# Patient Record
Sex: Female | Born: 1980 | Race: Black or African American | Hispanic: No | Marital: Married | Smoking: Never smoker
Health system: Southern US, Community
[De-identification: ages and names within clinical notes are randomized; demographics above are authoritative.]

---

## 2002-03-30 ENCOUNTER — Encounter: Payer: Self-pay | Admitting: *Deleted

## 2002-03-30 ENCOUNTER — Emergency Department (HOSPITAL_COMMUNITY): Admission: EM | Admit: 2002-03-30 | Discharge: 2002-03-30 | Payer: Self-pay | Admitting: Podiatry

## 2003-06-10 ENCOUNTER — Other Ambulatory Visit: Admission: RE | Admit: 2003-06-10 | Discharge: 2003-06-10 | Payer: Self-pay | Admitting: Obstetrics and Gynecology

## 2003-06-10 ENCOUNTER — Other Ambulatory Visit: Admission: RE | Admit: 2003-06-10 | Discharge: 2003-06-10 | Payer: Self-pay | Admitting: *Deleted

## 2003-11-27 ENCOUNTER — Ambulatory Visit (HOSPITAL_COMMUNITY): Admission: RE | Admit: 2003-11-27 | Discharge: 2003-11-27 | Payer: Self-pay | Admitting: Obstetrics and Gynecology

## 2003-12-05 ENCOUNTER — Inpatient Hospital Stay (HOSPITAL_COMMUNITY): Admission: AD | Admit: 2003-12-05 | Discharge: 2003-12-05 | Payer: Self-pay | Admitting: *Deleted

## 2003-12-07 ENCOUNTER — Observation Stay (HOSPITAL_COMMUNITY): Admission: AD | Admit: 2003-12-07 | Discharge: 2003-12-08 | Payer: Self-pay | Admitting: Obstetrics and Gynecology

## 2003-12-17 ENCOUNTER — Inpatient Hospital Stay (HOSPITAL_COMMUNITY): Admission: AD | Admit: 2003-12-17 | Discharge: 2003-12-21 | Payer: Self-pay | Admitting: Obstetrics and Gynecology

## 2004-03-31 ENCOUNTER — Emergency Department (HOSPITAL_COMMUNITY): Admission: EM | Admit: 2004-03-31 | Discharge: 2004-03-31 | Payer: Self-pay | Admitting: *Deleted

## 2004-05-05 ENCOUNTER — Other Ambulatory Visit: Admission: RE | Admit: 2004-05-05 | Discharge: 2004-05-05 | Payer: Self-pay | Admitting: Obstetrics and Gynecology

## 2005-02-08 ENCOUNTER — Inpatient Hospital Stay (HOSPITAL_COMMUNITY): Admission: AD | Admit: 2005-02-08 | Discharge: 2005-02-11 | Payer: Self-pay | Admitting: Obstetrics and Gynecology
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
## 2005-12-06 ENCOUNTER — Other Ambulatory Visit: Admission: RE | Admit: 2005-12-06 | Discharge: 2005-12-06 | Payer: Self-pay | Admitting: Family Medicine

## 2007-06-12 ENCOUNTER — Other Ambulatory Visit: Admission: RE | Admit: 2007-06-12 | Discharge: 2007-06-12 | Payer: Self-pay | Admitting: Family Medicine

## 2008-07-12 ENCOUNTER — Other Ambulatory Visit: Admission: RE | Admit: 2008-07-12 | Discharge: 2008-07-12 | Payer: Self-pay | Admitting: Family Medicine

## 2008-10-31 IMAGING — CR DG CHEST 1V
1 series · 1 of 1 positions shown · non-contrast
Comparison: NONE

CLINICAL DATA: (+) PPD. 

CHEST - SINGLE VIEW (AP)

[view not recorded]
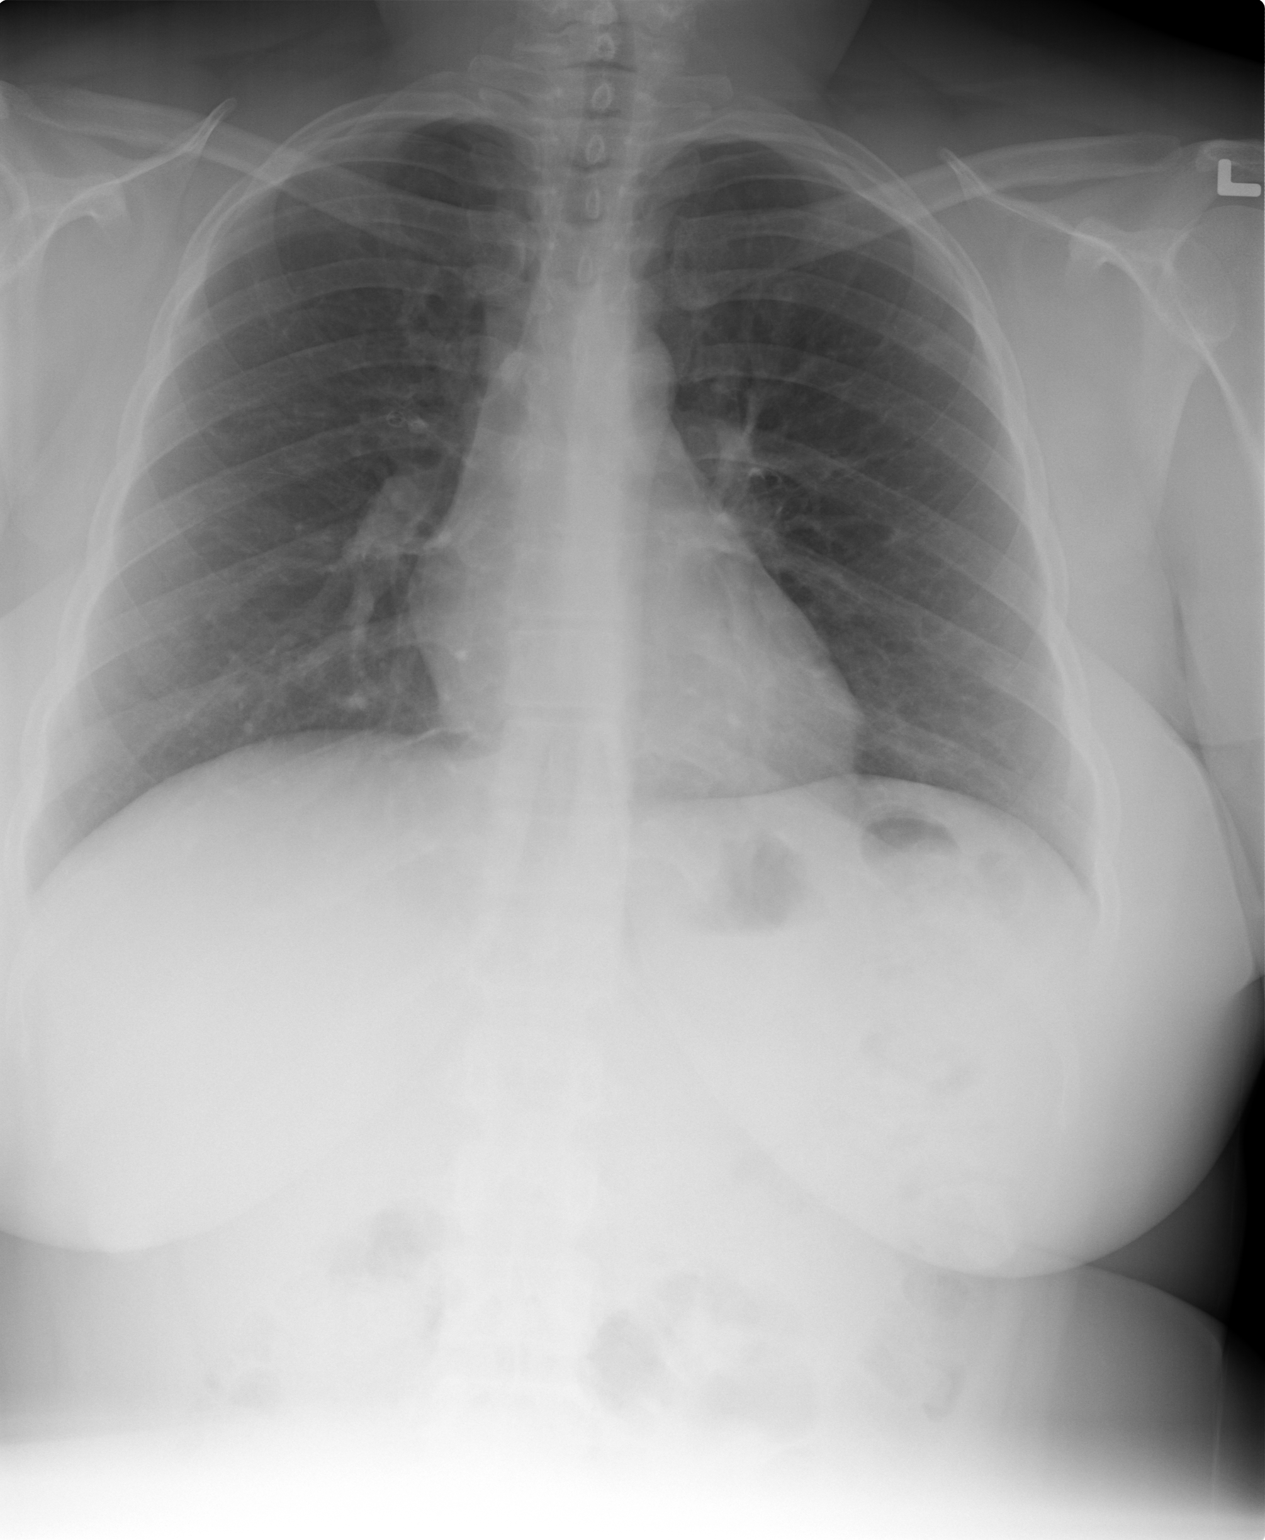

[1 of 1 positions shown; findings below may reference images not displayed]

FINDINGS: The lungs are clear and well expanded.   Heart and 
pulmonary vessels are normal. No significant abnormalities are 
noted in the regional skeleton.
IMPRESSION: No active disease.  No evidence for tuberculosis. 
Dict Date: 05/16/2006  Tran Date: 05/17/2006 NBC  JLM

## 2009-01-01 ENCOUNTER — Encounter: Admission: RE | Admit: 2009-01-01 | Discharge: 2009-01-01 | Payer: Self-pay | Admitting: Family Medicine

## 2010-01-03 ENCOUNTER — Inpatient Hospital Stay (HOSPITAL_COMMUNITY): Admission: AD | Admit: 2010-01-03 | Payer: Self-pay | Admitting: Obstetrics and Gynecology

## 2010-02-24 ENCOUNTER — Encounter: Payer: 59 | Attending: Obstetrics and Gynecology | Admitting: Dietician

## 2010-02-24 DIAGNOSIS — Z713 Dietary counseling and surveillance: Secondary | ICD-10-CM | POA: Insufficient documentation

## 2010-02-24 DIAGNOSIS — O99810 Abnormal glucose complicating pregnancy: Secondary | ICD-10-CM | POA: Insufficient documentation

## 2010-04-06 ENCOUNTER — Encounter (HOSPITAL_COMMUNITY)
Admission: RE | Admit: 2010-04-06 | Discharge: 2010-04-06 | Disposition: A | Discharge status: Home / Self Care | Payer: 59 | Source: Ambulatory Visit | Attending: Obstetrics and Gynecology | Admitting: Obstetrics and Gynecology

## 2010-04-06 LAB — BASIC METABOLIC PANEL
BUN: 8 mg/dL (ref 6–23)
CO2: 21 mEq/L (ref 19–32)
Calcium: 9.1 mg/dL (ref 8.4–10.5)
Chloride: 108 mEq/L (ref 96–112)
Creatinine, Ser: 0.73 mg/dL (ref 0.4–1.2)
GFR calc Af Amer: >60 mL/min (ref >60)
GFR calc non Af Amer: >60 mL/min (ref >60)
Glucose, Bld: 87 mg/dL (ref 70–99)
Potassium: 3.8 mEq/L (ref 3.5–5.1)
Sodium: 136 mEq/L (ref 135–145)

## 2010-04-06 LAB — URINALYSIS, ROUTINE W REFLEX MICROSCOPIC
Bilirubin Urine: NEGATIVE
Glucose, UA: NEGATIVE mg/dL
Hgb urine dipstick: NEGATIVE
Ketones, ur: 15 mg/dL — AB
Nitrite: NEGATIVE
Protein, ur: NEGATIVE mg/dL
Specific Gravity, Urine: 1.02 (ref 1.005–1.030)
Urobilinogen, UA: 0.2 mg/dL (ref 0.0–1.0)
pH: 6.5 (ref 5.0–8.0)

## 2010-04-06 LAB — CBC
HCT: 38.9 % (ref 36.0–46.0)
Hemoglobin: 12.7 g/dL (ref 12.0–15.0)
MCH: 25.5 pg — ABNORMAL LOW (ref 26.0–34.0)
MCHC: 32.6 g/dL (ref 30.0–36.0)
MCV: 78.1 fL (ref 78.0–100.0)
Platelets: 213 10*3/uL (ref 150–400)
RBC: 4.98 MIL/uL (ref 3.87–5.11)
RDW: 16.8 % — ABNORMAL HIGH (ref 11.5–15.5)
WBC: 9.1 10*3/uL (ref 4.0–10.5)

## 2010-04-06 LAB — RPR: RPR Ser Ql: NONREACTIVE

## 2010-04-07 LAB — SURGICAL PCR SCREEN
MRSA, PCR: NEGATIVE
Staphylococcus aureus: NEGATIVE

## 2010-04-13 ENCOUNTER — Inpatient Hospital Stay (HOSPITAL_COMMUNITY): Admission: RE | Admit: 2010-04-13 | Payer: 59 | Source: Ambulatory Visit | Admitting: Obstetrics and Gynecology

## 2010-04-13 ENCOUNTER — Inpatient Hospital Stay (HOSPITAL_COMMUNITY)
Admission: AD | Admit: 2010-04-13 | Discharge: 2010-04-15 | DRG: 766 | Disposition: A | Discharge status: Home / Self Care | Payer: 59 | Source: Ambulatory Visit | Attending: Obstetrics and Gynecology | Admitting: Obstetrics and Gynecology

## 2010-04-13 DIAGNOSIS — O99814 Abnormal glucose complicating childbirth: Secondary | ICD-10-CM | POA: Diagnosis present

## 2010-04-13 DIAGNOSIS — O34219 Maternal care for unspecified type scar from previous cesarean delivery: Principal | ICD-10-CM | POA: Diagnosis present

## 2010-04-13 DIAGNOSIS — E669 Obesity, unspecified: Secondary | ICD-10-CM | POA: Diagnosis present

## 2010-04-13 LAB — URINALYSIS, ROUTINE W REFLEX MICROSCOPIC
Bilirubin Urine: NEGATIVE
Glucose, UA: NEGATIVE mg/dL
Hgb urine dipstick: NEGATIVE
Ketones, ur: NEGATIVE mg/dL
Nitrite: NEGATIVE
Protein, ur: NEGATIVE mg/dL
Specific Gravity, Urine: 1.01 (ref 1.005–1.030)
Urobilinogen, UA: 0.2 mg/dL (ref 0.0–1.0)
pH: 6 (ref 5.0–8.0)

## 2010-04-13 LAB — ABO/RH: ABO/RH(D): B POS

## 2010-04-13 LAB — TYPE AND SCREEN
ABO/RH(D): B POS
Antibody Screen: NEGATIVE

## 2010-04-13 LAB — GLUCOSE, CAPILLARY: Glucose-Capillary: 138 mg/dL — ABNORMAL HIGH (ref 70–99)

## 2010-04-14 LAB — CBC
HCT: 36.5 % (ref 36.0–46.0)
Hemoglobin: 11.6 g/dL — ABNORMAL LOW (ref 12.0–15.0)
MCH: 25.1 pg — ABNORMAL LOW (ref 26.0–34.0)
MCHC: 31.8 g/dL (ref 30.0–36.0)
MCV: 78.8 fL (ref 78.0–100.0)
Platelets: 184 10*3/uL (ref 150–400)
RBC: 4.63 MIL/uL (ref 3.87–5.11)
RDW: 17 % — ABNORMAL HIGH (ref 11.5–15.5)
WBC: 10.6 10*3/uL — ABNORMAL HIGH (ref 4.0–10.5)

## 2010-04-14 LAB — GLUCOSE, CAPILLARY
Glucose-Capillary: 86 mg/dL (ref 70–99)
Glucose-Capillary: 92 mg/dL (ref 70–99)
Glucose-Capillary: 117 mg/dL — ABNORMAL HIGH (ref 70–99)

## 2010-04-15 LAB — GLUCOSE, CAPILLARY: Glucose-Capillary: 81 mg/dL (ref 70–99)

## 2010-05-14 NOTE — H&P (Addendum)
  Marissa, [name]           ACCOUNT NO.:  [id number]  MEDICAL RECORD NO.:  [id number]           PATIENT TYPE:  LOCATION:                                 FACILITY:  PHYSICIAN:  Janine Limbo, M.D.DATE OF BIRTH:  03-14-1980  DATE OF ADMISSION: DATE OF DISCHARGE:                             HISTORY & PHYSICAL   HISTORY OF PRESENT ILLNESS:  [name] is a 30 year old female, gravida 3, para 2-0-0-2, who presents at 9 weeks and 2-day gestation (EDC is April 18, 2010).  The patient has been followed at the [facility] and Gynecology Division [facility].  The pregnancy is complicated by the fact that she has had two cesarean sections in the past.  She desires a repeat cesarean section. She has gestational diabetes and her sugars have been well controlled. Her body mass index equals 43.  OBSTETRICAL HISTORY:  The patient has had two term cesarean deliveries (2005 and 2007).  DRUG ALLERGIES:  No known drug allergies or latex allergies.  She has no Betadine allergies.  She does have SEASONAL ALLERGIES.  PAST MEDICAL HISTORY:  The patient has a history of gestational hypertension.  Please see her history of present illness.  REVIEW OF SYSTEMS:  Normal pregnancy complaints.  SOCIAL HISTORY:  The patient denies cigarette use, alcohol use, and recreational drug use.  FAMILY HISTORY:  Noncontributory.  PHYSICAL EXAMINATION:  GENERAL:  Body mass index equals 43. HEENT:  Within normal limits. CHEST:  Clear. HEART:  Regular rate and rhythm. BREASTS:  Without masses. ABDOMEN:  Gravid with a fundal height of 37 cm. EXTREMITIES:  Grossly normal. NEUROLOGIC:  Normal. PELVIC:  Cervix is 1 cm dilated, 50% effaced, and -3 in station.  LABORATORY VALUES:  Blood type is B positive, antibody screen negative, sickle cell negative, VDRL nonreactive, rubella immune.  HbsAg negative, HIV nonreactive, GC negative, Chlamydia negative.  Third  trimester, beta strep is negative.  ASSESSMENT: 1. A 39-week and 2-day gestation. 2. Two prior cesarean sections. 3. Desires repeat cesarean section. 4. Body mass index equals 43. 5. Gestational diabetes mellitus with good sugar control by diet only.  PLAN:  The patient will undergo a repeat low-transverse cesarean section.  The patient understands the indications for her surgical procedure and she accepts the risks of, but not limited to, anesthetic complications, bleeding, infections, and possible damage to the surrounding organs.     Janine Limbo, M.D.     AVS/MEDQ  D:  04/11/2010  T:  04/11/2010  Job:  [id number]  Electronically Signed by Kirkland Hun M.D. on 04/13/2010 05:40:33 PM

## 2010-05-14 NOTE — Op Note (Signed)
NAMEAMIRE, GOSSEN           ACCOUNT NO.:  [id number]  MEDICAL RECORD NO.:  [id number]           PATIENT TYPE:  I  LOCATION:  9130                          [facility]  PHYSICIAN:  Janine Limbo, M.D.DATE OF BIRTH:  [birth year]  DATE OF PROCEDURE:  04/13/2010 DATE OF DISCHARGE:                              OPERATIVE REPORT   PREOPERATIVE DIAGNOSES: 1. 39-1/2 weeks' gestation. 2. Two prior cesarean sections. 3. Body mass index equals 43. 4. Desires repeat cesarean section. 5. Gestational diabetes mellitus well controlled by diet alone.  POSTOPERATIVE DIAGNOSES: 1. 39-1/2 weeks' gestation. 2. Two prior cesarean sections. 3. Body mass index equals 43. 4. Desires repeat cesarean section. 5. Gestational diabetes mellitus well controlled by diet alone.  PROCEDURE:  Repeat low transverse cesarean section.  SURGEON:  Janine Limbo, MD  FIRST ASSISTANT:  Candice Denny Levy, CNM  ANESTHETIC:  Spinal.  DISPOSITION:  [name] is a 30 year old female, gravida 3, para 2-0- 0-2 who presents at 39-1/2 weeks' gestation ([facility] is April 18, 2010). The patient has been followed at the [facility] and Gy[facility]cology Division of [facility].  The patient's pregnancy has been complicated by gestational diabetes mellitus that has been well controlled by diet only.  She has had 2 prior cesarean deliveries and she desires a repeat cesarean section.  She declines tubal sterilization.  She understands the indications for surgical procedure and she accepts the risks of, but not limited to, anesthetic complications, bleeding, infections, and possible damage to the surrounding organs.  FINDINGS:  A 6-pound 15-ounce female infant [name]) was delivered from a cephalic presentation.  The Apgar scores were 9 at 1 minute and 9 at 5 minutes.  The uterus, fallopian tubes, and the ovaries were normal for the gravid state.  PROCEDURE IN DETAILS:   The patient was taken to the operating room where a spinal anesthetic was placed.  The patient's abdomen was prepped with ChloraPrep and the perineum and vagina were prepped with Betadine.  A Foley catheter was placed in the bladder.  The patient was then sterilely draped.  The lower abdomen was injected with 10 mL of 0.5% Marcaine with epinephrine.  A low-transverse incision was made in the abdomen removing the prior scar.  The incision was extended through the subcutaneous tissue, fascia, and the anterior peritoneum.  The infant was delivered from a cephalic presentation without difficulty.  The mouth and nose were suctioned.  The remainder of the infant was then delivered.  The cord was clamped and cut and the infant was handed to the awaiting pediatric team.  Routine cord blood studies were obtained. The placenta was removed.  The uterine cavity was cleaned of amniotic fluid, clotted blood, and membranes.  The uterine incision was closed using a running locking suture of 2-0 Vicryl followed by an imbricating suture of 2-0 Vicryl.  Hemostasis was achieved using a figure-of-eight suture of 2-0 Vicryl.  The pelvis was vigorously irrigated.  The anterior peritoneum and the abdominal musculature were reapproximated in the midline using 2-0 Vicryl.  The fascia was closed using running suture of 0 Vicryl followed by 3 interrupted  sutures of 0 Vicryl.  A Jackson-Pratt drain was placed in the subcutaneous layer and brought out through the left lower quadrant.  It was sutured into place using 2-0 silk.  The subcutaneous layer was closed using interrupted sutures of 2- 0 Vicryl.  The skin was reapproximated using a subcuticular suture of 3- 0 Vicryl.  Sponge, needle, and instrument counts were correct on 2 occasions.  The estimated blood loss for the procedure was 700 mL.  The patient tolerated her procedure well.  She was transported to the recovery room in stable condition.  She was noted to  drain clear yellow urine at the end of her procedure.  The infant was taken to the full- term nursery in stable condition.     Janine Limbo, M.D.     AVS/MEDQ  D:  04/13/2010  T:  04/14/2010  Job:  [id number]  Electronically Signed by Kirkland Hun M.D. on 05/14/2010 03:12:47 AM

## 2010-05-14 NOTE — Discharge Summary (Signed)
[name], [name]           ACCOUNT NO.:  [id number]  MEDICAL RECORD NO.:  [id number]           PATIENT TYPE:  I  LOCATION:  9130                          [facility]  PHYSICIAN:  Osborn Coho, M.D.   DATE OF BIRTH:  [birth year]  DATE OF ADMISSION:  04/13/2010 DATE OF DISCHARGE:  04/15/2010                              DISCHARGE SUMMARY   ADMITTING DIAGNOSES: 1. Intrauterine pregnancy at 39-2/7th weeks. 2. Two prior cesarean sections with desire for repeat. 3. Elevated BMI. 4. Gestational diabetes, diet controlled.  DISCHARGE DIAGNOSES: 1. Intrauterine pregnancy at 39-2/7th weeks. 2. Two prior cesarean section with desire for repeat. 3. Elevated BMI. 4. Gestational diabetes, diet controlled.  PROCEDURES: 1. Repeat low transverse cesarean section. 2. Spinal anesthesia.  HOSPITAL COURSE:  Ms. Venters is a 30 year old gravida  3, para 2-0-0- 2, who presented early on the morning of April 13, 2010, with reports of contractions that began several hours before.  She was already scheduled for cesarean section repeated that day around noon.  She was monitored in maternity admissions unit until the time of her cesarean.  Her pregnancy had been remarkable for, 1. Previous cesarean sections with desire for repeat. 2. Body mass index 43. 3. Gestational diabetes, under good dietary control.    The patient was taken to the operating room by Dr. Stefano Gaul where a repeat low transverse cesarean section was performed for a viable female by the name of [name], weight 6 pounds  15 ounces, Apgars were 9 and 9, done under spinal anesthesia.  Infant was taken to the full-term nursery.  Mother was taken to recovery in good condition.  By postop day 1, the patient was doing well.  She was up ad lib without any difficulty, tolerating a regular diet, voiding without difficulty. She was breastfeeding and was hoping for early discharge on day 2.  Her hemoglobin on day 1 was 11.6,  down from 13.7.  White blood cell count was 10.6, platelet count was 184.  She did have a JP drain in place that was draining a small amount.  Her incision was clean, dry, and intact.  Physical exam was within normal limits.  Her fasting blood sugar was 186, 2-hour postprandial was 138. Rest of her hospital course was uncomplicated.  Dr. Su Hilt saw the patient on April 15, 2010 and determined the patient was appropriate for discharge. Physical exam was within normal limits.  JP drain was discontinued without difficulty, having had minimal drainage for 24 hours.  The patient was deemed to have received the full benefit of her hospital stay, and was discharged home in stable condition.  Patient plans Nexplanon for contraception.  DISCHARGE INSTRUCTIONS:  Per [facility] handout.  DISCHARGE MEDICATIONS: 1. Motrin 600 mg p.o. q.6 h. p.r.n. pain. 2. Percocet 5/325, 1-2 p.o. q.3-4 h. p.r.n. pain.  DISCHARGE FOLLOWUP:  Discharge followup will occur in 6 weeks at [facility].       Renaldo Reel Emilee Hero, C.N.M.   ______________________________ Osborn Coho, M.D.    VLL/MEDQ  D:  04/15/2010  T:  04/16/2010  Job:  [id number]  Electronically Signed by Nigel Bridgeman C.N.M. on  04/16/2010 07:12:22 AM Electronically Signed by Osborn Coho M.D. on 05/14/2010 07:38:39 AM

## 2010-05-22 NOTE — Op Note (Signed)
[name], [name]           ACCOUNT NO.:  [id number]   MEDICAL RECORD NO.:  [id number]          PATIENT TYPE:  INP   LOCATION:  9133                          [facility]   PHYSICIAN:  [name], M.D.DATE OF BIRTH:  26-Sep-1980   DATE OF PROCEDURE:  12/18/2003  DATE OF DISCHARGE:                                 OPERATIVE REPORT   PREOPERATIVE DIAGNOSES:  1.  Term intrauterine pregnancy.  2.  Pregnancy induced hypertension.  3.  Failure to progress in labor.   POSTOPERATIVE DIAGNOSES:  1.  Term intrauterine pregnancy.  2.  Pregnancy induced hypertension.  3.  Failure to progress in labor.  4.  Persistent right occiput transverse position.   PROCEDURE:  Primary low transverse cesarean section.   SURGEON:  [name], M.D.   FIRST ASSISTANT:  [name], C.N.M.   ANESTHESIA:  Epidural.   DISPOSITION:  [name] is a 30 year old female, gravida 1, para 0, who  presents at term.  Her pregnancy has been complicated by third trimester  pregnancy induced hypertension.  She was admitted for induction of labor on  December 17, 2003.  The patient's membranes were ruptured on December 18, 2003 and she dilated her cervix to 8-9 cm.  She could dilate no further in  spite of adequate [place] unit.  A cesarean section was offered and  accepted.  The risks were reviewed including, but not limited to, anesthetic  complications, bleeding, infections, and possible damage to the surrounding  organs.   FINDINGS:  A 6 pound 14 ounce female infant ([name]) was delivered from a  right occiput transverse presentation. The Apgar's were 7 at 1 minute and 8  at 5 minutes.  There was significant caput noted for the baby. There was a  shoulder cord present. The uterus, fallopian tubes and the ovaries were  normal for the gravid state.   DESCRIPTION OF PROCEDURE:  The patient was taken to the operating room where  it was determined the epidural she had received  for labor would be adequate  for a cesarean delivery.  A Foley catheter had previously been placed. The  patient's abdomen was prepped with multiple layers of Betadine and then  sterilely draped. The lower abdomen was injected with 10 mL of 0.5% Marcaine  with epinephrine. A low transverse incision was made and carried sharply  through the subcutaneous tissue, the fascia, and the anterior peritoneum. An  incision was made in the lower uterine segment and a bladder flap was  developed.  The incision was then extended in a low transverse fashion on  the lower uterine segment. The fetal head was delivered without difficulty.  The mouth and nose were suctioned.  The remainder of the infant was then  delivered. the cord was clamped and cut and the infant was handed to the  awaiting pediatric team. Routine cord blood studies were obtained. The  placenta was removed. The uterine cavity was cleaned of amniotic fluid,  clotted blood and membranes. The uterine incision was closed using a running  locking suture of 2-0 Vicryl followed by an imbricating suture of  2-0  Vicryl. Figure-of-eight sutures of 2-0 Vicryl and #0 Vicryl were used for  hemostasis.  Hemostasis was adequate.  The pelvic cavity was then vigorously  irrigated.  The anterior peritoneum and the abdominal musculature were  reapproximated in the midline using the 2-0 Vicryl.  The fascia and the  subcutaneous tissue were irrigated. The fascia was closed using a running  suture of #0 Vicryl followed by three interrupted sutures of #0 Vicryl. The  subcutaneous layer was then carefully inspected and hemostasis was adequate.  A Jackson-Pratt drain was placed in the subcutaneous layer and brought out  through the left lower quadrant.  It was sutured into place using 3-0 silk.  The subcutaneous layer was closed using a running suture of #0 Vicryl and  the skin was reapproximated using a subcuticular suture of 4-0 Vicryl.  Sponge, needle and  instrument counts were correct on two occasions. The  estimated blood loss for the procedure was 800 mL.  The patient tolerated  the procedure well.  She was taken to the recovery room in stable condition.  The infant was taken to the full term nursery in stable condition.     Arth   AVS/MEDQ  D:  12/18/2003  T:  12/18/2003  Job:  [id number]
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
## 2010-05-22 NOTE — H&P (Signed)
[name], [name]           ACCOUNT NO.:  [id number]   MEDICAL RECORD NO.:  [id number]          PATIENT TYPE:  INP   LOCATION:  NA                            [facility]   PHYSICIAN:  Janine Limbo, M.D.DATE OF BIRTH:  Jun 29, 1980   DATE OF ADMISSION:  DATE OF DISCHARGE:                                HISTORY & PHYSICAL   DATE OF SURGERY:  February 11, 2005.   HISTORY OF PRESENT ILLNESS:  The patient is a 30 year old female, gravida 2,  para 1-0-0-1, who presents for a repeat cesarean section.  The patient has  been followed at the [facility] and Gynecology Division of  [facility].  The pregnancy has been largely  uncomplicated.  She has had a prior cesarean section.   OBSTETRIC HISTORY:  The patient had a low-transverse cesarean section in  December of 2005 where she delivered a 6 pound 14 ounce female infant.  The  diagnosis was failure to progress in labor.   DRUG ALLERGIES:  NO KNOWN DRUG ALLERGIES.   PAST MEDICAL HISTORY:  The patient had pregnancy induced hypertension with  her previous pregnancy.  She has done well with this pregnancy.   SOCIAL HISTORY:  The patient denies cigarette use, alcohol use and  recreational drug use.   REVIEW OF SYSTEMS:  Normal pregnancy complaints.   FAMILY HISTORY:  The patient's mother has asthma.  Her maternal grandmother  has diabetes.   PHYSICAL EXAMINATION:  VITAL SIGNS:  Weight is 219 pounds, height of 5 feet  1 inch.  HEENT:  Within normal limits.  CHEST:  Clear.  HEART:  Regular rate and rhythm.  BREASTS:  Without masses.  ABDOMEN:  Gravida with a fundal height of 38 cm.  EXTREMITIES:  Within normal limits.  NEUROLOGIC EXAMINATION:  Grossly normal.  PELVIC EXAMINATION:  External genitalia is normal.  The vagina is normal.  Cervix was closed, 50% effaced and -2 station.   LABORATORY VALUES:  Blood type is B positive, antibody screen negative,  sickle cell negative, VDRL is  nonreactive, rubella positive, HBSAG negative,  Pap within normal limits, alpha fetoprotein screen within normal limits,  third trimester beta strep is negative, Glucola screen is 132.   ASSESSMENT:  1.  A 39-week gestation (Estimated date of confinement February 17, 2005).  2.  Prior cesarean section.  3.  Desires repeat cesarean section.   PLAN:  The patient will have a repeat low-transverse cesarean section.  She  understands the indications for her surgical procedure and she accepts the  risk of, but not limited to, anesthetic complications, bleeding, infections  and possible damage to the surrounding organs.      Janine Limbo, M.D.  Electronically Signed     AVS/MEDQ  D:  02/04/2005  T:  02/04/2005  Job:  [id number]

## 2010-05-22 NOTE — Discharge Summary (Signed)
[name], [name]           ACCOUNT NO.:  [id number]   MEDICAL RECORD NO.:  [id number]          PATIENT TYPE:  INP   LOCATION:  9124                          [facility]   PHYSICIAN:  Hal Morales, M.D.DATE OF BIRTH:  11-10-1980   DATE OF ADMISSION:  02/08/2005  DATE OF DISCHARGE:  02/11/2005                                 DISCHARGE SUMMARY   ADMITTING DIAGNOSES:  1.  Intrauterine pregnancy at 38+ weeks.  2.  Previous cesarean section.  3.  Active labor.   DISCHARGE DIAGNOSES:  1.  Intrauterine pregnancy at 38+ weeks.  2.  Previous cesarean section.  3.  Active labor.  4.  Status post repeat low transverse cesarean section of a viable female      infant named [name], Apgars 8 and 9 weighing 7 pounds 14 ounces.  5.  Pelvic adhesions.   HOSPITAL PROCEDURES:  1.  Electronic fetal monitoring.  2.  Spinal anesthesia.  3.  Repeat low transverse cesarean section.   HOSPITAL COURSE:  Patient entered the hospital in labor and expressed a  desire to have a repeat cesarean section which was performed without  complication.  Patient was taken to recovery and then to mother/baby unit  where she did well.  On postoperative day #1 she was getting out of bed and  taking food by mouth without problems and received routine postoperative  care.  Postoperative day #2 she continued to increase her ambulation.  Hemoglobin was 8.9.  The patient was without any syncope and continued to  improve.  On postoperative day #3 she was ready to go home.  Vital signs  were stable.  She was afebrile.  Chest was clear.  Heart rate regular rate  and rhythm.  Abdomen was soft and appropriately tender.  Incision was clean,  dry, and intact with subcuticular sutures.  Lochia within normal limits.  Extremities within normal limits.  The patient was deemed to have received  the full benefit of her hospital stay, was discharged home.   DISCHARGE MEDICATIONS:  1.  Motrin 600 mg p.o. q.6h. p.r.n.  2.   Tylox one to two p.o. q.4h. p.r.n.  3.  Prenatal vitamins one p.o. daily.   DISCHARGE LABORATORIES:  White blood cell count 9.5, hemoglobin 8.9,  platelets 252.   DISCHARGE INSTRUCTIONS:  Per [facility] handout.   DISCHARGE FOLLOW-UP:  Six weeks or p.r.n.      Marie L. Williams, C.N.M.      Hal Morales, M.D.  Electronically Signed    MLW/MEDQ  D:  02/11/2005  T:  02/11/2005  Job:  [id number]
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
## 2010-05-22 NOTE — Op Note (Signed)
[name], [name]           ACCOUNT NO.:  [id number]   MEDICAL RECORD NO.:  [id number]          PATIENT TYPE:  INP   LOCATION:  9124                          [facility]   PHYSICIAN:  Crist Fat. Rivard, M.D. DATE OF BIRTH:  28-Dec-1980   DATE OF PROCEDURE:  02/08/2005  DATE OF DISCHARGE:                                 OPERATIVE REPORT   PREOPERATIVE DIAGNOSES:  1.  Intrauterine pregnancy at 38 weeks and 5 days.  2.  Previous cesarean section in active labor.   POSTOPERATIVE DIAGNOSES:  1.  Intrauterine pregnancy at 38 weeks and 5 days.  2.  Previous cesarean section in active labor.   ANESTHESIA:  Spinal.   PROCEDURE:  Repeat low transverse cesarean section.   SURGEON:  Dr. Estanislado Pandy   ASSISTANT:  Elsie Ra, CNM   PROCEDURE:  After being informed of the planned procedure with possible  complications including bleeding, infection, injury to bowel, bladder, and  ureters, which was done previously as this patient had been consented for a  repeat C-section on February 8.  Informed consent was obtained and signed,  and the patient was taken to OR #1, given spinal anesthesia without  complication.  She was placed in the dorsal decubitus position, pelvis  tilted to the left, prepped and draped in a sterile fashion, and a Foley  catheter was inserted in her bladder.   After assessing adequate level of anesthesia, we infiltrate the previous  incision with 20 mL of Marcaine 0.25, and we sharply removed the keloid.  This incision was then brought down sharply to the fascia which was incised  in a low transverse fashion.  Linea alba is dissected, and peritoneum is  entered bluntly.  At this moment, we note that the omentum is completely  adherent to the anterior wall, and we proceed with systematic sharp  dissection of these adhesions.  Each small area of omentum is separated,  clamped with 2 Kelly clamps, sectioned, and both stumps are sutured with a  free tie of 2-0 Vicryl.   This is done until the omentum can then be moved to  safely upward which then allows [place] to see the lower uterine segment.  The  entry in the peritoneum is extended bluntly.  We can now incise in a low  transverse fashion the visceroperitoneum which allows [place] to safely retract  bladder by developing a bladder flap.   Myometrium is then entered in a low transverse fashion, first sharply then  extended bluntly.  Amniotic fluid is clear.  We assist the birth of a female  infant at 8:09 a.m.  Mouth and nose were suctioned.  Nuchal cord is reduced;  baby is delivered.  Cord is clamped with 2 Kelly clamps and sectioned, and  the baby is given to the pediatrician present in the room.  The patient has  received a dose of Ancef 1 gram IV, and 20 mL of blood is drawn from the  umbilical vein.  The placenta is allowed to deliver spontaneously.  It is  complete; the cord has 3 vessels, and uterine revision is negative.   Myometrium  is then closed in 2 layers, first with a running locked suture of  0 Vicryl then with a Lembert suture of 0 Vicryl, imbricating the first one.  Hemostasis of the myometrium is assessed and adequate.  Both paracolic  gutters are cleaned, both tubes and ovaries assessed and normal.  The pelvis  is then profusely irrigated with warm saline, and hemostasis is rechecked  and deemed adequate.   Under fascia hemostasis is completed with cautery, and the fascia is closed  with 2 running sutures of 0 Vicryl meeting midline.  The incision is then  irrigated with warm saline.  Hemostasis is completed with cautery and  because the patient is now starting to have some sensitivity and pain, an  extra 10 mL of Marcaine 0.25 is used to irrigate the incision.  The skin is  then closed with a subcuticular suture of 3-0 Monocryl and Steri-Strips.   Instruments and sponge count is complete x2.  Estimated blood loss is 700  mL.  The procedure is very well tolerated by the patient who is taken  to the  recovery room in a well and stable condition.   [name] was born at 8:09, received an Apgar 8 at one  minute 9 at five minutes, and weighs 7 pounds 14 ounces.      Crist Fat Rivard, M.D.  Electronically Signed     SAR/MEDQ  D:  02/08/2005  T:  02/08/2005  Job:  [id number]

## 2010-05-22 NOTE — Discharge Summary (Signed)
[name], Marissa           ACCOUNT NO.:  [id number]   MEDICAL RECORD NO.:  [id number]          PATIENT TYPE:  INP   LOCATION:  9133                          [facility]   PHYSICIAN:  Osborn Coho, M.D.   DATE OF BIRTH:  1980-01-29   DATE OF ADMISSION:  12/17/2003  DATE OF DISCHARGE:                                 DISCHARGE SUMMARY   ADMISSION DIAGNOSES:  1.  Intrauterine pregnancy at term.  2.  Pregnancy-induced hypertension.  3.  Desires induction of labor.   DISCHARGE DIAGNOSES:  1.  Intrauterine pregnancy at term.  2.  Pregnancy-induced hypertension.  3.  Desires induction of labor.  4.  Failure to progress in labor.  5.  Persistent occiput transverse position.  6.  Breastfeeding.  7.  Undecided regarding contraception.  8.  Resolving pregnancy-induced hypertension.   PROCEDURES THIS ADMISSION:  Primary low transverse cesarean section for  delivery of a viable female infant named Chyna who weighed 6 pounds 14  ounces and had Apgars of 7 and 8 on December 18, 2003 by Dr. Leonard Schwartz and Mack Guise, C.N.M.   HOSPITAL COURSE:  [name] is a 30 year old gravida 1 para 0 at 12 and  five-sevenths weeks who was admitted for induction of labor secondary to  pregnancy-induced hypertension.  She had been previously offered induction  but had declined and then elected to proceed with induction of labor on  December 17, 2003.  She was admitted for Cytotec cervical ripening with then  Pitocin to follow the subsequent morning.  She progressed to approximately 9  cm but failed to dilate beyond that even with adequate [place] units and  had cervical swelling and increased caput.  A cesarean section was  recommended for delivery and the patient agreed and underwent the same for  delivery of a viable female infant named Chyna who had Apgars of 7 and 8 and  weighed 6 pounds 14 ounces on December 18, 2003; attended in delivery by Dr.  Leonard Schwartz and Mack Guise, C.N.M.  Please see operative  note for further details.  Postoperatively, the patient has done well.  She  is now ambulating, voiding, and eating without difficulty.  Her vital signs  are essentially stable.  Her blood pressures have been anywhere from 110s to  130s over 60s to 90s with the majority being diastolics in the 70s to 80s.  She is afebrile.  She is tolerating a regular diet without difficulty.  She  is breastfeeding also without difficulty.  She is currently on labetalol 100  mg p.o. b.i.d. for her blood pressure.  She is deemed ready for discharge  today.   DISCHARGE INSTRUCTIONS:  As per the [facility] OB/GYN handout.   DISCHARGE MEDICATIONS:  1.  Motrin 600 mg p.o. q.6h. p.r.n. for pain.  2.  Tylox one to two p.o. q.4-6h. p.r.n. for pain.  3.  Prenatal vitamins daily.  4.  Labetalol 100 mg p.o. b.i.d.   DISCHARGE LABORATORY DATA:  Her hemoglobin is 9.8, her wbc count is 13.5,  and her platelets are 275.   DISCHARGE FOLLOW-UP:  Will be in 1 week with home health visit for blood  pressure check, and 4-6 weeks at [facility] OB[facility] or p.r.n.   DISCHARGE STATUS:  Well and stable.     Shel   SJD/MEDQ  D:  12/21/2003  T:  12/21/2003  Job:  [id number]
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
## 2010-05-22 NOTE — Discharge Summary (Signed)
NAMEMYLISSA, LAMBE           ACCOUNT NO.:  [id number]   MEDICAL RECORD NO.:  [id number]          PATIENT TYPE:  INP   LOCATION:  9152                          [facility]   PHYSICIAN:  Janine Limbo, M.D.DATE OF BIRTH:  Jan 21, 1980   DATE OF ADMISSION:  12/07/2003  DATE OF DISCHARGE:  12/08/2003                                 DISCHARGE SUMMARY   ADMISSION DIAGNOSES:  1.  Intrauterine pregnancy at 45 and two-sevenths weeks.  2.  Pregnancy-induced hypertension, evaluate for preeclampsia.   DISCHARGE DIAGNOSES:  1.  Intrauterine pregnancy at 33 and three-sevenths weeks.  2.  Pregnancy-induced hypertension without evidence of preeclampsia, stable.   Mrs. Lehrman is a 30 year old married black female.  She is a gravida 1  para 0 at 64 and three-sevenths weeks who presented yesterday on December 07, 2003 to return her 24-hour urine specimen.  She did not have any PIH  symptoms at that time.  However, her blood pressures were elevated to 140s  over 90s to 100s.  Her PIH labs were all within normal limits and her total  protein for her 24-hour urine was less than 5.  A urine specimen on December 07, 2003 is negative for protein.  She was begun on labetalol 100 mg p.o.  b.i.d. and her blood pressures have remained stabilized in the 110s over  60s.  She remains stable with no PIH symptoms.  Her fetal heart rate  baseline is 140s with average long-term variability, positive accelerations.  Fetal heart rate is reactive with no decelerations noted.  There is a very  occasional contraction noted on tocodynamometer.  Her abdomen is soft and  nontender.  Extremities show 1+ edema.  DTRs are 2+ with no clonus.  She is  therefore discharged home in stable condition on labetalol 100 mg p.o.  b.i.d. for blood pressure control.  She will remain on bedrest at home and  call for any signs or symptoms of PIH; any headache, visual changes, or  epigastric pain.  She will call for any signs or  symptoms of labor or any  problems or concerns.  Her next prenatal visit at the office of [facility] is on  Tuesday, December 10, 2003.     Pecolia Ades   SDM/MEDQ  D:  12/08/2003  T:  12/09/2003  Job:  [id number]

## 2014-03-11 ENCOUNTER — Other Ambulatory Visit (HOSPITAL_COMMUNITY)
Admission: RE | Admit: 2014-03-11 | Discharge: 2014-03-11 | Disposition: A | Discharge status: Home / Self Care | Payer: 59 | Source: Ambulatory Visit | Attending: Physician Assistant | Admitting: Physician Assistant

## 2014-03-11 ENCOUNTER — Other Ambulatory Visit: Payer: Self-pay | Admitting: Physician Assistant

## 2014-03-11 DIAGNOSIS — Z1151 Encounter for screening for human papillomavirus (HPV): Secondary | ICD-10-CM | POA: Insufficient documentation

## 2014-03-11 DIAGNOSIS — Z01419 Encounter for gynecological examination (general) (routine) without abnormal findings: Secondary | ICD-10-CM | POA: Diagnosis not present

## 2014-03-12 LAB — CYTOLOGY - PAP

## 2014-08-26 ENCOUNTER — Encounter: Payer: Self-pay | Admitting: Skilled Nursing Facility1

## 2014-08-26 ENCOUNTER — Encounter: Payer: 59 | Attending: Family Medicine | Admitting: Skilled Nursing Facility1

## 2014-08-26 VITALS — Ht 63.0 in | Wt 255.0 lb

## 2014-08-26 DIAGNOSIS — Z713 Dietary counseling and surveillance: Secondary | ICD-10-CM | POA: Diagnosis not present

## 2014-08-26 DIAGNOSIS — Z6841 Body Mass Index (BMI) 40.0 and over, adult: Secondary | ICD-10-CM | POA: Diagnosis not present

## 2014-08-26 DIAGNOSIS — E669 Obesity, unspecified: Secondary | ICD-10-CM | POA: Insufficient documentation

## 2014-08-26 NOTE — Patient Instructions (Signed)
-  Motivators: Wanting a change, Diabetes, and Want more energy/be able to play with the kids -Barriers: Time, Rather have someone to do things with, and contemplate what else -Make all of your meals balanced -Slow down during your meals-take 20 minutes to eat and then wait 30 minutes after to decide weather you are hungry or not -Aim for 5 days a week 30 minutes of physical activity -Take one day at least each week to plan meals and snacks

## 2014-08-26 NOTE — Progress Notes (Signed)
  Medical Nutrition Therapy:  Appt start time: 1030 end time:  1130.   Assessment:  Primary concerns today: [employer] employee. early childhood at [facility]. Pt states she has a busy schedule and would like to eat healthier. Pt states she is on Implanon injections for birth control. Pt states her usual wt is unknown due to her 3 pregnancies, last pregnancy being 4 years ago. Pt states she was educated 4 years ago by a dietitian and got a diet plan which helped her to keep from gaining wt. Pt states she was 215 pounds and it begin to increase due to working 3rd shift and not being as physically active. Pt states she is very busy and does not sleep well, maybe 4 hours of sleep.Pt states she was going to the gym and was able to lose 10 pounds but then gained that weight back.   Pt states she is feeling shaky and needs to eat something most days of the week. Pt states she is a picky eater  Preferred Learning Style:   No preference indicated   Learning Reading:  Contemplating   MEDICATIONS: Birth control   DIETARY INTAKE:  Usual eating pattern includes 1 meals and 1 snacks per day.  Everyday foods include none stated.  Avoided foods include none stated.    24-hr recall:  B ( AM): none  Snk ( AM): none L ( PM): none Snk ( PM): peanut butter crackers D ( PM): fast food----spagetti Snk ( PM): none Beverages: water, soda, lemonade  Usual physical activity: AdL's  Estimated energy needs: 1800 calories 200 g carbohydrates 135 g protein 50 g fat  Progress Towards Goal(s):  In progress.   Nutritional Diagnosis:  [place]-3.3 Overweight/obesity As related to one large meal a day and no physical activity.  As evidenced by BMI 45.3, pt report, and 24 hr recall..    Intervention:  Nutrition counseling for obesity. Dietitian educated the pt on the importance of physical activity, honoring her hunger and fullness cues, a balanced/varied diet, and staying motivated.  Teaching Method Utilized:   Visual Auditory  Handouts given during visit include:  MyPlate  Snack sheet  Barriers to learning/adherence to lifestyle change: Time  Demonstrated degree of understanding via:  Teach Back   Monitoring/Evaluation:  Dietary intake, exercise, and body weight prn.

## 2015-10-03 DIAGNOSIS — R430 Anosmia: Secondary | ICD-10-CM | POA: Diagnosis not present

## 2015-10-03 DIAGNOSIS — E559 Vitamin D deficiency, unspecified: Secondary | ICD-10-CM | POA: Diagnosis not present

## 2015-10-03 DIAGNOSIS — Z0000 Encounter for general adult medical examination without abnormal findings: Secondary | ICD-10-CM | POA: Diagnosis not present

## 2015-10-03 DIAGNOSIS — Z13220 Encounter for screening for lipoid disorders: Secondary | ICD-10-CM | POA: Diagnosis not present

## 2015-10-27 DIAGNOSIS — H52222 Regular astigmatism, left eye: Secondary | ICD-10-CM | POA: Diagnosis not present

## 2016-02-03 DIAGNOSIS — R635 Abnormal weight gain: Secondary | ICD-10-CM | POA: Diagnosis not present

## 2016-02-03 DIAGNOSIS — Z309 Encounter for contraceptive management, unspecified: Secondary | ICD-10-CM | POA: Diagnosis not present

## 2016-02-03 DIAGNOSIS — Z3046 Encounter for surveillance of implantable subdermal contraceptive: Secondary | ICD-10-CM | POA: Diagnosis not present

## 2016-02-16 DIAGNOSIS — Z30430 Encounter for insertion of intrauterine contraceptive device: Secondary | ICD-10-CM | POA: Diagnosis not present

## 2016-02-16 DIAGNOSIS — Z113 Encounter for screening for infections with a predominantly sexual mode of transmission: Secondary | ICD-10-CM | POA: Diagnosis not present

## 2016-05-27 DIAGNOSIS — Z30431 Encounter for routine checking of intrauterine contraceptive device: Secondary | ICD-10-CM | POA: Diagnosis not present

## 2016-09-03 DIAGNOSIS — R51 Headache: Secondary | ICD-10-CM | POA: Diagnosis not present

## 2016-09-09 DIAGNOSIS — Z6841 Body Mass Index (BMI) 40.0 and over, adult: Secondary | ICD-10-CM | POA: Diagnosis not present

## 2016-09-09 DIAGNOSIS — Z713 Dietary counseling and surveillance: Secondary | ICD-10-CM | POA: Diagnosis not present

## 2016-09-09 DIAGNOSIS — Z7182 Exercise counseling: Secondary | ICD-10-CM | POA: Diagnosis not present

## 2016-10-05 DIAGNOSIS — Z30432 Encounter for removal of intrauterine contraceptive device: Secondary | ICD-10-CM | POA: Diagnosis not present

## 2016-10-05 DIAGNOSIS — H52222 Regular astigmatism, left eye: Secondary | ICD-10-CM | POA: Diagnosis not present

## 2016-10-05 DIAGNOSIS — H5203 Hypermetropia, bilateral: Secondary | ICD-10-CM | POA: Diagnosis not present

## 2016-10-05 DIAGNOSIS — Z309 Encounter for contraceptive management, unspecified: Secondary | ICD-10-CM | POA: Diagnosis not present

## 2016-10-15 DIAGNOSIS — S86912A Strain of unspecified muscle(s) and tendon(s) at lower leg level, left leg, initial encounter: Secondary | ICD-10-CM | POA: Diagnosis not present

## 2016-10-15 DIAGNOSIS — M25562 Pain in left knee: Secondary | ICD-10-CM | POA: Diagnosis not present

## 2016-10-15 DIAGNOSIS — Z713 Dietary counseling and surveillance: Secondary | ICD-10-CM | POA: Diagnosis not present

## 2017-03-29 DIAGNOSIS — R300 Dysuria: Secondary | ICD-10-CM | POA: Diagnosis not present

## 2017-06-10 DIAGNOSIS — Z0000 Encounter for general adult medical examination without abnormal findings: Secondary | ICD-10-CM | POA: Diagnosis not present

## 2017-06-10 DIAGNOSIS — Z6841 Body Mass Index (BMI) 40.0 and over, adult: Secondary | ICD-10-CM | POA: Diagnosis not present

## 2017-06-10 DIAGNOSIS — Z124 Encounter for screening for malignant neoplasm of cervix: Secondary | ICD-10-CM | POA: Diagnosis not present

## 2017-06-10 DIAGNOSIS — Z131 Encounter for screening for diabetes mellitus: Secondary | ICD-10-CM | POA: Diagnosis not present

## 2017-06-10 DIAGNOSIS — Z13220 Encounter for screening for lipoid disorders: Secondary | ICD-10-CM | POA: Diagnosis not present

## 2017-12-19 DIAGNOSIS — Z111 Encounter for screening for respiratory tuberculosis: Secondary | ICD-10-CM | POA: Diagnosis not present

## 2018-12-25 DIAGNOSIS — R040 Epistaxis: Secondary | ICD-10-CM | POA: Diagnosis not present

## 2018-12-27 ENCOUNTER — Emergency Department (HOSPITAL_COMMUNITY)
Admission: EM | Admit: 2018-12-27 | Discharge: 2018-12-27 | Disposition: A | Discharge status: Home / Self Care | Payer: 59 | Attending: Emergency Medicine | Admitting: Emergency Medicine

## 2018-12-27 DIAGNOSIS — R040 Epistaxis: Secondary | ICD-10-CM | POA: Insufficient documentation

## 2018-12-27 MED ORDER — OXYMETAZOLINE HCL 0.05 % NA SOLN
1.0000 | Freq: Once | NASAL | Status: AC
  Administered 2018-12-27: 1 via NASAL
  Filled 2018-12-27: qty 30

## 2018-12-27 NOTE — ED Provider Notes (Signed)
[facility] EMERGENCY DEPARTMENT Provider Note   CSN: [id number] Arrival date & time: 12/27/18  1517     History Chief Complaint  Patient presents with  . Epistaxis    [name] is a 38 y.o. female.  The history is provided by the patient and medical records.  Epistaxis   38 y.o. F presenting to the ED with nosebleed.  States she has had this happen intermittently over the past 2 weeks, most recent was 2 days ago.  States she saw her PCP for this, felt to be related to dry, cold air.  She was told to use afrin but has not yet picked it up.  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  She states it has bled from both sides in the past.  No falls or facial trauma.  Not currently on anticoagulation.  No fever, chills, recent illness.  No past medical history on file.  There are no problems to display for this patient.   No past surgical history on file.   OB History   No obstetric history on file.     No family history on file.  Social History   Tobacco Use  . Smoking status: Not on file  Substance Use Topics  . Alcohol use: Not on file  . Drug use: Not on file    Home Medications Prior to Admission medications   Not on File    Allergies    Patient has no allergy information on record.  Review of Systems   Review of Systems  HENT: Positive for nosebleeds.   All other systems reviewed and are negative.   Physical Exam Updated Vital Signs BP (!) 179/110 (BP Location: Left Wrist)   Pulse (!) 105   Temp 98.5 F (36.9 C) (Oral)   Resp 16   SpO2 100%   Physical Exam Vitals and nursing note reviewed.  Constitutional:      Appearance: She is well-developed.  HENT:     Head: Normocephalic and atraumatic.     Nose:     Comments: Large clot removed from right nostril, no active bleeding currently, nasal passages do appear somewhat dry Eyes:   Conjunctiva/sclera: Conjunctivae normal.     Pupils: Pupils are equal, round, and reactive to light.  Cardiovascular:     Rate and Rhythm: Normal rate and regular rhythm.     Heart sounds: Normal heart sounds.  Pulmonary:     Effort: Pulmonary effort is normal.     Breath sounds: Normal breath sounds.  Abdominal:     General: Bowel sounds are normal.     Palpations: Abdomen is soft.  Musculoskeletal:        General: Normal range of motion.     Cervical back: Normal range of motion.  Skin:    General: Skin is warm and dry.  Neurological:     Mental Status: She is alert and oriented to person, place, and time.     ED Results / Procedures / Treatments   Labs (all labs ordered are listed, but only abnormal results are displayed) Labs Reviewed - No data to display  EKG None  Radiology No results found.  Procedures Procedures (including critical care time)  Medications Ordered in ED Medications  oxymetazoline (AFRIN) 0.05 % nasal spray 1 spray (1 spray Each Nare Given 12/27/18 0106)    ED Course  I have reviewed  the triage vital signs and the nursing notes.  Pertinent labs & imaging results that were available during my care of the patient were reviewed by me and considered in my medical decision making (see chart for details).    MDM Rules/Calculators/A&P  38 year old female presenting to the ED with nosebleed. Was upstairs on break eating a cookie when she had a nosebleed from the right nostril. Has been having issues with the same over the past 2 weeks. Seen by PCP 2 days ago and felt like this was related to dry air. Instructed to use Afrin but has not yet picked this up. On arrival she is holding tissue up to her nose, this was removed and large clot was expelled from the right nostril. There is not appear to be any active bleeding. Nasal passages do appear somewhat dry. Will give dose of Afrin and reassess to ensure no recurrent bleeding.  1:30 AM No further  bleeding after approx 30 mins and afrin.  She is ready to go which ifeel this reasonable.  We have discussed continued use of Afrin as needed, but no more than 3 consecutive days.  I recommended that she can use a saline nasal spray to try to keep mucous membranes of the nose moist, also has a humidifier at home that she will use.  She may follow-up with PCP.  Return here for any new/acute changes.  Final Clinical Impression(s) / ED Diagnoses Final diagnoses:  Epistaxis    Rx / DC Orders ED Discharge Orders    None       Garlon Hatchet, PA-C 12/27/18 0201    Zadie Rhine, MD 12/27/18 [phone]

## 2018-12-27 NOTE — ED Triage Notes (Signed)
Pt was upstair eating a cookie when she started "gushing blood out of her mouth and nose" about 10 minutes ago. Pt nose has been bleeding since. Pt went to the doctor 2 days ago for similar occurrence, states her results came back normal. Pt denies LOC, taking any nasal sprays or blood thinners.

## 2018-12-27 NOTE — Discharge Instructions (Signed)
Can continue using afrin when needed-- no more than 3 consecutive days in a row. Can use a saline nasal spray, vaseline, and home humidifier to try and keep nasal passages moist. Follow-up with your primary care doctor. Return here for any new/acute changes.

## 2019-02-27 DIAGNOSIS — Z124 Encounter for screening for malignant neoplasm of cervix: Secondary | ICD-10-CM | POA: Diagnosis not present

## 2019-02-27 DIAGNOSIS — Z13220 Encounter for screening for lipoid disorders: Secondary | ICD-10-CM | POA: Diagnosis not present

## 2019-02-27 DIAGNOSIS — Z131 Encounter for screening for diabetes mellitus: Secondary | ICD-10-CM | POA: Diagnosis not present

## 2019-02-27 DIAGNOSIS — Z0000 Encounter for general adult medical examination without abnormal findings: Secondary | ICD-10-CM | POA: Diagnosis not present

## 2019-08-24 DIAGNOSIS — Z23 Encounter for immunization: Secondary | ICD-10-CM | POA: Diagnosis not present

## 2019-09-14 DIAGNOSIS — Z23 Encounter for immunization: Secondary | ICD-10-CM | POA: Diagnosis not present

## 2019-10-29 DIAGNOSIS — M25552 Pain in left hip: Secondary | ICD-10-CM | POA: Diagnosis not present

## 2019-11-07 DIAGNOSIS — R071 Chest pain on breathing: Secondary | ICD-10-CM | POA: Diagnosis not present

## 2020-03-04 DIAGNOSIS — M2550 Pain in unspecified joint: Secondary | ICD-10-CM | POA: Diagnosis not present

## 2020-03-04 DIAGNOSIS — K439 Ventral hernia without obstruction or gangrene: Secondary | ICD-10-CM | POA: Diagnosis not present

## 2020-03-10 DIAGNOSIS — Z131 Encounter for screening for diabetes mellitus: Secondary | ICD-10-CM | POA: Diagnosis not present

## 2020-03-10 DIAGNOSIS — Z0000 Encounter for general adult medical examination without abnormal findings: Secondary | ICD-10-CM | POA: Diagnosis not present

## 2020-03-10 DIAGNOSIS — Z13220 Encounter for screening for lipoid disorders: Secondary | ICD-10-CM | POA: Diagnosis not present

## 2020-03-10 DIAGNOSIS — E559 Vitamin D deficiency, unspecified: Secondary | ICD-10-CM | POA: Diagnosis not present

## 2020-03-18 DIAGNOSIS — K429 Umbilical hernia without obstruction or gangrene: Secondary | ICD-10-CM | POA: Diagnosis not present

## 2020-03-18 DIAGNOSIS — K439 Ventral hernia without obstruction or gangrene: Secondary | ICD-10-CM | POA: Diagnosis not present

## 2020-04-10 ENCOUNTER — Ambulatory Visit: Payer: 59 | Admitting: Internal Medicine

## 2020-04-15 DIAGNOSIS — R222 Localized swelling, mass and lump, trunk: Secondary | ICD-10-CM | POA: Diagnosis not present

## 2020-04-17 ENCOUNTER — Other Ambulatory Visit: Payer: Self-pay | Admitting: Surgery

## 2020-04-17 ENCOUNTER — Other Ambulatory Visit: Payer: Self-pay

## 2020-04-17 ENCOUNTER — Ambulatory Visit: Payer: Self-pay

## 2020-04-17 ENCOUNTER — Ambulatory Visit: Payer: 59 | Admitting: Internal Medicine

## 2020-04-17 ENCOUNTER — Encounter: Payer: Self-pay | Admitting: Internal Medicine

## 2020-04-17 VITALS — BP 113/80 | HR 85 | Ht 61.5 in | Wt 235.4 lb

## 2020-04-17 DIAGNOSIS — R220 Localized swelling, mass and lump, head: Secondary | ICD-10-CM | POA: Diagnosis not present

## 2020-04-17 DIAGNOSIS — M059 Rheumatoid arthritis with rheumatoid factor, unspecified: Secondary | ICD-10-CM | POA: Insufficient documentation

## 2020-04-17 DIAGNOSIS — R430 Anosmia: Secondary | ICD-10-CM | POA: Insufficient documentation

## 2020-04-17 DIAGNOSIS — M79642 Pain in left hand: Secondary | ICD-10-CM | POA: Diagnosis not present

## 2020-04-17 DIAGNOSIS — M0589 Other rheumatoid arthritis with rheumatoid factor of multiple sites: Secondary | ICD-10-CM

## 2020-04-17 DIAGNOSIS — M0580 Other rheumatoid arthritis with rheumatoid factor of unspecified site: Secondary | ICD-10-CM

## 2020-04-17 DIAGNOSIS — N644 Mastodynia: Secondary | ICD-10-CM | POA: Insufficient documentation

## 2020-04-17 DIAGNOSIS — R222 Localized swelling, mass and lump, trunk: Secondary | ICD-10-CM

## 2020-04-17 DIAGNOSIS — E559 Vitamin D deficiency, unspecified: Secondary | ICD-10-CM | POA: Insufficient documentation

## 2020-04-17 DIAGNOSIS — L819 Disorder of pigmentation, unspecified: Secondary | ICD-10-CM

## 2020-04-17 DIAGNOSIS — M79641 Pain in right hand: Secondary | ICD-10-CM | POA: Insufficient documentation

## 2020-04-17 NOTE — Progress Notes (Signed)
Office Visit Note  Patient: [name]             Date of Birth: 11-May-1980           MRN: [id number]             PCP: Aurea Graff, PA-C (Inactive) Referring: Carolee Rota, NP Visit Date: 04/17/2020 Occupation: [facility]  Subjective:  New Patient (Initial Visit) (Patient complains of bilateral hand pain (left>right). )   History of Present Illness: [name] is a 40 y.o. female here for evaluation of bilateral hand pain, swelling and positive rheumatoid factor Abs. Left hip pain started about 2 months ago with no associated other problems at the time. She took a short duration of steroids with resolution of symptoms. After finishing that medicine she has noticed hand pain and numbness worst on the left but involving both hands. She does not recall any medical events or changes except for COVID vaccine in the preceding period. She notices fingers turning blue at end of work shift, not particularly correlated with temperature or activity. She tried wearing brace on left wrist and started using right hand more this has helped the left hand partially. Since about 1 month ago noticing dark discoloration around eyes and facial swelling, also linear rashes on the backs of hands.  Activities of Daily Living:  Patient reports morning stiffness for 30 minutes.   Patient Reports nocturnal pain.  Difficulty dressing/grooming: Reports Difficulty climbing stairs: Denies Difficulty getting out of chair: Denies Difficulty using hands for taps, buttons, cutlery, and/or writing: Reports  Review of Systems  Constitutional: Positive for fatigue.  HENT: Negative for mouth sores, mouth dryness and nose dryness.   Eyes: Negative for pain, itching, visual disturbance and dryness.  Respiratory: Negative for cough, hemoptysis, shortness of breath and difficulty breathing.   Cardiovascular: Negative for chest pain, palpitations and swelling in legs/feet.  Gastrointestinal:  Negative for abdominal pain, blood in stool, constipation and diarrhea.  Endocrine: Negative for increased urination.  Genitourinary: Negative for painful urination.  Musculoskeletal: Positive for arthralgias, joint pain, joint swelling and morning stiffness. Negative for myalgias, muscle weakness, muscle tenderness and myalgias.  Skin: Positive for color change. Negative for rash and redness.  Allergic/Immunologic: Negative for susceptible to infections.  Neurological: Positive for numbness and weakness. Negative for dizziness, headaches and memory loss.  Hematological: Negative for swollen glands.  Psychiatric/Behavioral: Negative for confusion and sleep disturbance.    PMFS History:  Patient Active Problem List   Diagnosis Date Noted  . Morbid obesity with BMI of 40.0-44.9, adult ([place]) 04/17/2020  . Anosmia 04/17/2020  . Pain of breast 04/17/2020  . Vitamin D deficiency 04/17/2020  . Bilateral hand pain 04/17/2020  . 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 Facial swelling 04/17/2020  . Discoloration of skin of finger 04/17/2020    History reviewed. No pertinent past medical history.  Family History  Problem Relation Age of Onset  . Diabetes Mother   . Healthy Daughter   . Healthy Daughter   . Healthy Son    Past Surgical History:  Procedure Laterality Date  . CESAREAN SECTION  2012  . CESAREAN SECTION  2007  . CESAREAN SECTION  2005   Social History   Social History Narrative  . Not on file   Immunization History  Administered Date(s) Administered  . Influenza Split 01/23/2014, 10/24/2019  . PFIZER(Purple Top)SARS-COV-2 Vaccination 08/24/2019, 09/14/2019  . Td 01/15/2003  . Tdap 07/23/2009  Objective: Vital Signs: BP 113/80 (BP Location: Right Arm, Patient Position: Sitting, Cuff Size: Large)   Pulse 85   Ht 5' 1.5" (1.562 m)   Wt 235 lb 6.4 oz (106.8 kg)   BMI 43.76 kg/m    Physical Exam Constitutional:      Appearance: She  is obese.  HENT:     Right Ear: External ear normal.     Left Ear: External ear normal.     Mouth/Throat:     Mouth: Mucous membranes are moist.     Pharynx: Oropharynx is clear.  Eyes:     Conjunctiva/sclera: Conjunctivae normal.  Cardiovascular:     Rate and Rhythm: Normal rate and regular rhythm.  Pulmonary:     Effort: Pulmonary effort is normal.     Breath sounds: Normal breath sounds.  Skin:    General: Skin is warm and dry.     Capillary Refill: Capillary refill takes less than 2 seconds.     Comments: Central facial swelling Linear patches of hyperpigmentation on dorsum of hands and fingers Faint discoloration of fingertips, no digital fat pad or nail pitting Normal nailfold capillaroscopy  Neurological:     General: No focal deficit present.     Mental Status: She is alert.     Deep Tendon Reflexes: Reflexes normal.  Psychiatric:        Mood and Affect: Mood normal.     Musculoskeletal Exam:  Neck full ROM no tenderness Shoulders full ROM no tenderness or swelling Elbows full ROM no tenderness or swelling Wrists full ROM no tenderness, left wrist slightly swollen compared to right Fingers left 1st and 2nd digits pain with MCP and PIP flexion, right fingers okay Knees full ROM no tenderness or swelling Ankles full ROM no tenderness or swelling  Investigation: No additional findings.  Imaging: XR Hand 2 View Left  Result Date: 04/17/2020 X-ray left hand 2 views Radiocarpal carpal joint spaces appear normal.  MCP, PIP, DIP joint spaces appear normal.  No osteophytes or erosions seen.  Bone mineralization appears normal. Impression Normal hand x-ray  XR Hand 2 View Right  Result Date: 04/17/2020 X-ray right hand 2 views Radiocarpal carpal joint spaces appear normal.  MCP, PIP, and DIP joint spaces appear normal.  No osteophytes or erosions seen.  Bone mineralization appears normal. Impression Normal hand x-ray   Recent Labs: Lab Results  Component Value Date    WBC 10.6 (H) 04/14/2010   HGB 11.6 (L) 04/14/2010   PLT 184 04/14/2010   NA 136 04/06/2010   K 3.8 04/06/2010   CL 108 04/06/2010   CO2 21 04/06/2010   GLUCOSE 87 04/06/2010   BUN 8 04/06/2010   CREATININE 0.73 04/06/2010   CALCIUM 9.1 04/06/2010   GFRAA  04/06/2010    >60        The eGFR has been calculated using the MDRD equation. This calculation has not been validated in all clinical situations. eGFR's persistently <60 mL/min signify possible Chronic Kidney Disease.    Speciality Comments: No specialty comments available.  Procedures:  No procedures performed Allergies: Patient has no known allergies.   Assessment / Plan:     Visit Diagnoses: Bilateral hand pain Polyarthritis with positive rheumatoid factor (HCC) - Plan: XR Hand 2 View Right, XR Hand 2 View Left, Cyclic citrul peptide antibody, IgG, ANA, Urinalysis, Routine w reflex microscopic, TSH  Bilateral hand pain probably some carpal tunnel syndrome contribution at least on left hand but also seems to have tenderness  to palpation on some flexor tendons. Checking CCP Abs, ANA, also TSH and screening urinalysis. Checking baselien hand xrays to rule out degenerative or erosive changes.  Facial swelling  Cental facial swelling and areas of hyperpigmentation is suggestive for an inflammatory or maybe metabolic process rather than just localized hand symptoms. She did also have recent steroid treatment could be contributing.  Discoloration of skin of finger  Fingertip discoloration could represent raynaud's symptoms can also be seen with CTS.  Orders: Orders Placed This Encounter  Procedures  . XR Hand 2 View Right  . XR Hand 2 View Left  . Cyclic citrul peptide antibody, IgG  . ANA  . Urinalysis, Routine w reflex microscopic  . TSH   No orders of the defined types were placed in this encounter.    Follow-Up Instructions: No follow-ups on file.   Collier Salina, MD  Note - This record has been  created using [employer].  Chart creation errors have been sought, but may not always  have been located. Such creation errors do not reflect on  the standard of medical care.

## 2020-04-18 LAB — URINALYSIS, ROUTINE W REFLEX MICROSCOPIC
Bilirubin Urine: NEGATIVE
Glucose, UA: NEGATIVE
Leukocytes,Ua: NEGATIVE

## 2020-04-21 LAB — URINALYSIS, ROUTINE W REFLEX MICROSCOPIC
Hgb urine dipstick: NEGATIVE
Ketones, ur: NEGATIVE
Nitrite: NEGATIVE
Protein, ur: NEGATIVE
Specific Gravity, Urine: 1.025 (ref 1.001–1.03)
pH: 5.5 (ref 5.0–8.0)

## 2020-04-21 LAB — ANA: Anti Nuclear Antibody (ANA): POSITIVE — AB

## 2020-04-21 LAB — TSH: TSH: 1.4 mIU/L

## 2020-04-21 LAB — ANTI-NUCLEAR AB-TITER (ANA TITER): ANA Titer 1: 1:1280 {titer} — ABNORMAL HIGH

## 2020-04-21 LAB — CYCLIC CITRUL PEPTIDE ANTIBODY, IGG: Cyclic Citrullin Peptide Ab: <16 UNITS

## 2020-04-21 NOTE — Progress Notes (Signed)
Lab results show a highly positive ANA result, this is often associated with problems such as lupus or similar conditions that could cause some of her current symptoms. We need to schedule follow up soon and will need to check some more specific tests to narrow this down before discussed treatments. It is probably not just uncomplicated carpal tunnel syndrome. Thyroid function is normal.

## 2020-04-21 NOTE — Progress Notes (Signed)
Hand xrays look normal there is no joint damage or changes from arthritis.

## 2020-04-23 ENCOUNTER — Other Ambulatory Visit: Payer: Self-pay

## 2020-04-23 ENCOUNTER — Ambulatory Visit: Payer: 59 | Admitting: Internal Medicine

## 2020-04-23 ENCOUNTER — Encounter: Payer: Self-pay | Admitting: Internal Medicine

## 2020-04-23 VITALS — BP 140/92 | HR 89 | Ht 61.5 in | Wt 234.8 lb

## 2020-04-23 DIAGNOSIS — R220 Localized swelling, mass and lump, head: Secondary | ICD-10-CM | POA: Diagnosis not present

## 2020-04-23 DIAGNOSIS — M79641 Pain in right hand: Secondary | ICD-10-CM

## 2020-04-23 DIAGNOSIS — R768 Other specified abnormal immunological findings in serum: Secondary | ICD-10-CM | POA: Diagnosis not present

## 2020-04-23 DIAGNOSIS — L819 Disorder of pigmentation, unspecified: Secondary | ICD-10-CM

## 2020-04-23 DIAGNOSIS — M0580 Other rheumatoid arthritis with rheumatoid factor of unspecified site: Secondary | ICD-10-CM | POA: Diagnosis not present

## 2020-04-23 DIAGNOSIS — M79642 Pain in left hand: Secondary | ICD-10-CM

## 2020-04-23 NOTE — Progress Notes (Signed)
Office Visit Note  Patient: [name]             Date of Birth: 09-12-1980           MRN: [id number]             PCP: Roseanna Rainbow, PA-C (Inactive) Referring: No ref. provider found Visit Date: 04/23/2020   Subjective:  Follow-up (Patient denies change in symptoms- here for further testing / narrow things down prior to discussing treatment options. )   History of Present Illness: [name] is a 40 y.o. female here for follow up for joint pain and swelling affecting the hands and knees along with finger numbness and discoloration with positive rheumatoid factor and ANA antibodies. She has no change in symptoms since last week.  Review of Systems  Constitutional: Positive for fatigue.  HENT: Negative for mouth sores, mouth dryness and nose dryness.   Eyes: Negative for pain, itching, visual disturbance and dryness.  Respiratory: Negative for cough, hemoptysis, shortness of breath and difficulty breathing.   Cardiovascular: Negative for chest pain, palpitations and swelling in legs/feet.  Gastrointestinal: Negative for abdominal pain, blood in stool, constipation and diarrhea.  Endocrine: Negative for increased urination.  Genitourinary: Negative for painful urination.  Musculoskeletal: Positive for arthralgias, joint pain, joint swelling and morning stiffness. Negative for myalgias, muscle weakness, muscle tenderness and myalgias.  Skin: Positive for rash. Negative for color change and redness.  Allergic/Immunologic: Negative for susceptible to infections.  Neurological: Negative for dizziness, numbness, headaches, memory loss and weakness.  Hematological: Negative for swollen glands.  Psychiatric/Behavioral: Negative for confusion and sleep disturbance.   Previous HPI: [name] is a 40 y.o. female here for evaluation of bilateral hand pain, swelling and positive rheumatoid factor Abs. Left hip pain started about 2 months ago with no associated  other problems at the time. She took a short duration of steroids with resolution of symptoms. After finishing that medicine she has noticed hand pain and numbness worst on the left but involving both hands. She does not recall any medical events or changes except for COVID vaccine in the preceding period. She notices fingers turning blue at end of work shift, not particularly correlated with temperature or activity. She tried wearing brace on left wrist and started using right hand more this has helped the left hand partially. Since about 1 month ago noticing dark discoloration around eyes and facial swelling, also linear rashes on the backs of hands.   PMFS History:  Patient Active Problem List   Diagnosis Date Noted  . Morbid obesity with BMI of 40.0-44.9, adult (HCC) 04/17/2020  . Anosmia 04/17/2020  . Pain of breast 04/17/2020  . Vitamin D deficiency 04/17/2020  . Bilateral hand pain 04/17/2020  . Polyarthritis with positive rheumatoid factor (HCC) 04/17/2020  . Facial swelling 04/17/2020  . Discoloration of skin of finger 04/17/2020    History reviewed. No pertinent past medical history.  Family History  Problem Relation Age of Onset  . Diabetes Mother   . Healthy Daughter   . Healthy Daughter   . Healthy Son    Past Surgical History:  Procedure Laterality Date  . CESAREAN SECTION  2012  . CESAREAN SECTION  2007  . CESAREAN SECTION  2005   Social History   Social History Narrative  . Not on file   Immunization History  Administered Date(s) Administered  . Influenza Split 01/23/2014, 10/24/2019  . PFIZER(Purple Top)SARS-COV-2 Vaccination 08/24/2019, 09/14/2019  . Td  01/15/2003  . Tdap 07/23/2009     Objective: Vital Signs: BP (!) 140/92 (BP Location: Left Arm, Patient Position: Sitting, Cuff Size: Large)   Pulse 89   Ht 5' 1.5" (1.562 m)   Wt 234 lb 12.8 oz (106.5 kg)   BMI 43.65 kg/m    Physical Exam Constitutional:      Appearance: She is obese.  HENT:      Mouth/Throat:     Mouth: Mucous membranes are moist.     Pharynx: Oropharynx is clear.  Eyes:     Conjunctiva/sclera: Conjunctivae normal.  Skin:    General: Skin is warm and dry.     Comments: Darker coloration on distal finger pads on left hand 2-4th fingers Central facial edema with periorbital hyperpigmentation Linear excoriations on dorsum of hands, round excoriations on extremities  Neurological:     Mental Status: She is alert.     Musculoskeletal Exam:  Neck full ROM no tenderness Shoulders full ROM no tenderness or swelling Elbows full ROM no tenderness or swelling Wrists full ROM mildly tender no swelling, warmth, redness palpable Fingers full ROM no tenderness or swelling Knees full ROM no tenderness or swelling  Investigation: No additional findings.  Imaging: XR Hand 2 View Left  Result Date: 04/17/2020 X-ray left hand 2 views Radiocarpal carpal joint spaces appear normal.  MCP, PIP, DIP joint spaces appear normal.  No osteophytes or erosions seen.  Bone mineralization appears normal. Impression Normal hand x-ray  XR Hand 2 View Right  Result Date: 04/17/2020 X-ray right hand 2 views Radiocarpal carpal joint spaces appear normal.  MCP, PIP, and DIP joint spaces appear normal.  No osteophytes or erosions seen.  Bone mineralization appears normal. Impression Normal hand x-ray   Recent Labs: Lab Results  Component Value Date   WBC 4.5 04/23/2020   HGB 12.7 04/23/2020   PLT 334 04/23/2020   NA 141 04/23/2020   K 4.6 04/23/2020   CL 109 04/23/2020   CO2 26 04/23/2020   GLUCOSE 90 04/23/2020   BUN 15 04/23/2020   CREATININE 0.70 04/23/2020   BILITOT 0.3 04/23/2020   AST 24 04/23/2020   ALT 22 04/23/2020   PROT 7.1 04/23/2020   CALCIUM 8.9 04/23/2020   GFRAA 126 04/23/2020    Speciality Comments: No specialty comments available.  Procedures:  No procedures performed Allergies: Patient has no known allergies.   Assessment / Plan:     Visit  Diagnoses: Positive ANA (antinuclear antibody) Bilateral hand pain Facial swelling Polyarthritis with positive rheumatoid factor (HCC) - Plan: Anti-scleroderma antibody, RNP Antibody, Anti-Smith antibody, Sjogrens syndrome-A extractable nuclear antibody, Sjogrens syndrome-B extractable nuclear antibody, Anti-DNA antibody, double-stranded, C3 and C4, CBC with Differential/Platelet, COMPLETE METABOLIC PANEL WITH GFR  Positive ANA with joint pain and swelling, skin rashes, swelling, raynaud's with nailfold capillary changes suggestive for SLE or at least an active CTD. Checking ENA panel today also APS Abs and checking CBC and CMP. Previous urinalysis was unremarkable so no suspicion for glomerulonephritis at this time. We discussed treatment would likely try HCQ as first line option whether SLE, RA or overlap condition. Provided information to review and discussed medication risks including arrhythmia and retinal toxicity requiring annual monitoring.  Discoloration of skin of finger  Chronic dusky discoloration suspect related to raynaud's phenomenon with abnormal nailfold capillary exam. More likely CTD related than CTS type problem.  Orders: Orders Placed This Encounter  Procedures  . Anti-scleroderma antibody  . RNP Antibody  . Anti-Smith antibody  .  Sjogrens syndrome-A extractable nuclear antibody  . Sjogrens syndrome-B extractable nuclear antibody  . Anti-DNA antibody, double-stranded  . C3 and C4  . CBC with Differential/Platelet  . COMPLETE METABOLIC PANEL WITH GFR   No orders of the defined types were placed in this encounter.    Follow-Up Instructions: Return in about 2 months (around 06/23/2020) for HCQ start f/u.   Fuller Plan, MD  Note - This record has been created using [employer].  Chart creation errors have been sought, but may not always  have been located. Such creation errors do not reflect on  the standard of medical care.

## 2020-04-23 NOTE — Patient Instructions (Addendum)
 Anti-DNA Antibody Test Why am I having this test? The anti-DNA antibody test helps with the diagnosis and follow-up of systemic lupus erythematosus (SLE). It is also used to monitor treatment of this condition as the antibody decreases with successful therapy. What is being tested? This test measures the amount of anti-DNA antibody in the blood. This antibody is found in 65-80% of patients with active SLE. This antibody is not as common in patients who have other diseases. What kind of sample is taken? A blood sample is required for this test. It is usually collected by inserting a needle into a blood vessel.   How are the results reported? Your test results will be reported as a value. Your test results may also be reported as positive, intermediate, or negative. Your health care provider will compare your results to normal ranges that were established after testing a large group of people (reference values). Reference values may vary among labs and hospitals. For this test, common reference values are:  Positive: 10 or more international units/mL.  Intermediate: 5-9 international units/mL.  Negative: Less than 5 international units/mL. What do the results mean? Positive results, which are associated with results that are higher than the reference values, may indicate:  Autoimmune disorders such as SLE.  Infectious mononucleosis.  Chronic liver conditions. Intermediate results mean that the anti-DNA antibody levels are higher than normal, but not high enough to be considered positive. Negative results mean that you do not have the anti-DNA antibody that is associated with these conditions. Talk with your health care provider about what your results mean. Questions to ask your health care provider Ask your health care provider, or the department that is doing the test:  When will my results be ready?  How will I get my results?  What are my treatment options?  What other tests  do I need?  What are my next steps? Summary  The anti-DNA antibody test helps with the diagnosis and follow-up of systemic lupus erythematosus (SLE). It is also used to monitor treatment of this condition as the antibody decreases with successful therapy.  This test measures the amount of anti-DNA antibody in the blood.  Elevated levels of anti-DNA antibody can be seen in patients with SLE and certain other conditions.    Complement Assay Test Why am I having this test? Complement refers to a group of proteins that are part of the body's disease-fighting system (immune system). A complement assay test provides information about some or all of these proteins. You may have this test:  To diagnose a lack, or deficiency, of certain complement proteins. Deficiencies can be passed from parent to child (inherited).  To monitor an infection or autoimmune disease.  If you have unexplained inflammation or swelling (edema).  If you have bacterial infections again and again. What is being tested? This test can be used to measure:  Total complement. This is the total number of protein complements in your blood.  The number of each kind of complement in your blood. The nine main kinds of complement are labeled C1 through C9. Some of these complements, such as C3 and C4, are especially important and have many functions in the body. Depending on why you are having the test, your health care provider may test your total complement or only some individual complements, such as C3 and C4. The total complement assay test may be done before individual complements are tested. What kind of sample is taken? A blood sample is required   for this test. It is usually collected by inserting a needle into a blood vessel.   Tell a health care provider about:  Any allergies you have.  All medicines you are taking, including vitamins, herbs, eye drops, creams, and over-the-counter medicines.  Any blood disorders  you have.  Any surgeries you have had.  Any medical conditions you have.  Whether you are pregnant or may be pregnant. How are the results reported? Your results will be reported as a value that tells you how much complement is in your blood. This will be given as units per milliliter of blood (units/mL) or as milligrams per deciliter of blood (mg/dL). Your results may be reported as total complement, or as individual complements, or both. Your health care provider will compare your results to normal ranges that were established after testing a large group of people (reference ranges). Reference ranges may vary among labs and hospitals. For this test, reference ranges for some of the most commonly measured complement assays may be:  Total complement: 30-75 units/mL.  C2: 1-4 mg/dL.  C3: 75-175 mg/dL.  C4: 22-45 units/mL. What do the results mean? Results within reference ranges are considered normal, which means you have a normal amount of complement in your blood. Results that are higher than the reference ranges may be caused by:  Inflammatory disease.  Heart attack.  Cancer. Complement deficiencies, or results lower than the reference ranges, may be caused by:  Certain inherited conditions.  Autoimmune disease.  Certain liver diseases.  Malnutrition.  Certain types of anemia that result in breakdown of red blood cells (hemolytic anemia). Talk with your health care provider about what your results mean. Questions to ask your health care provider Ask your health care provider, or the department that is doing the test:  When will my results be ready?  How will I get my results?  What are my treatment options?  What other tests do I need?  What are my next steps? Summary  Complement refers to a group of proteins that are part of the body's disease-fighting system (immune system). A complement assay test can provide information about some or all of these  proteins.  You may have a complement assay test to help diagnose a complement deficiency, and to monitor some infections or autoimmune disease.  Talk with your health care provider about what your results mean.   Hydroxychloroquine tablets What is this medicine? HYDROXYCHLOROQUINE (hye drox ee KLOR oh kwin) is used to treat rheumatoid arthritis and systemic lupus erythematosus. It is also used to treat malaria. This medicine may be used for other purposes; ask your health care provider or pharmacist if you have questions. COMMON BRAND NAME(S): Plaquenil, Quineprox What should I tell my health care provider before I take this medicine? They need to know if you have any of these conditions:  diabetes  eye disease, vision problems  G6PD deficiency  heart disease  history of irregular heartbeat  if you often drink alcohol  kidney disease  liver disease  porphyria  psoriasis  an unusual or allergic reaction to chloroquine, hydroxychloroquine, other medicines, foods, dyes, or preservatives  pregnant or trying to get pregnant  breast-feeding How should I use this medicine? Take this medicine by mouth with a glass of water. Take it as directed on the prescription label. Do not cut, crush or chew this medicine. Swallow the tablets whole. Take it with food. Do not take it more than directed. Take all of this medicine unless your  health care provider tells you to stop it early. Keep taking it even if you think you are better. Take products with antacids in them at a different time of day than this medicine. Take this medicine 4 hours before or 4 hours after antacids. Talk to your health care provider if you have questions. Talk to your pediatrician regarding the use of this medicine in children. While this drug may be prescribed for selected conditions, precautions do apply. Overdosage: If you think you have taken too much of this medicine contact a poison control center or emergency  room at once. NOTE: This medicine is only for you. Do not share this medicine with others. What if I miss a dose? If you miss a dose, take it as soon as you can. If it is almost time for your next dose, take only that dose. Do not take double or extra doses. What may interact with this medicine? Do not take this medicine with any of the following medications:  cisapride  dronedarone  pimozide  thioridazine This medicine may also interact with the following medications:  ampicillin  antacids  cimetidine  cyclosporine  digoxin  kaolin  medicines for diabetes, like insulin, glipizide, glyburide  medicines for seizures like carbamazepine, phenobarbital, phenytoin  mefloquine  methotrexate  other medicines that prolong the QT interval (cause an abnormal heart rhythm)  praziquantel This list may not describe all possible interactions. Give your health care provider a list of all the medicines, herbs, non-prescription drugs, or dietary supplements you use. Also tell them if you smoke, drink alcohol, or use illegal drugs. Some items may interact with your medicine. What should I watch for while using this medicine? Visit your health care provider for regular checks on your progress. Tell your health care provider if your symptoms do not start to get better or if they get worse. You may need blood work done while you are taking this medicine. If you take other medicines that can affect heart rhythm, you may need more testing. Talk to your health care provider if you have questions. Your vision may be tested before and during use of this medicine. Tell your health care provider right away if you have any change in your eyesight. This medicine may cause serious skin reactions. They can happen weeks to months after starting the medicine. Contact your health care provider right away if you notice fevers or flu-like symptoms with a rash. The rash may be red or purple and then turn into  blisters or peeling of the skin. Or, you might notice a red rash with swelling of the face, lips or lymph nodes in your neck or under your arms. If you or your family notice any changes in your behavior, such as new or worsening depression, thoughts of harming yourself, anxiety, or other unusual or disturbing thoughts, or memory loss, call your health care provider right away. What side effects may I notice from receiving this medicine? Side effects that you should report to your doctor or health care professional as soon as possible:  allergic reactions (skin rash, itching or hives; swelling of the face, lips, or tongue)  changes in vision  decreased hearing, ringing in the ears  heartbeat rhythm changes (trouble breathing; chest pain; dizziness; fast, irregular heartbeat; feeling faint or lightheaded, falls)  liver injury (dark yellow or brown urine; general ill feeling or flu-like symptoms; loss of appetite, right upper belly pain; unusually weak or tired, yellowing of the eyes or skin)  low  blood sugar (feeling anxious; confusion; dizziness; increased hunger; unusually weak or tired; increased sweating; shakiness; cold, clammy skin; irritable; headache; blurred vision; fast heartbeat; loss of consciousness)  low red blood cell counts (trouble breathing; feeling faint; lightheaded, falls; unusually weak or tired)  muscle weakness  pain, tingling, numbness in the hands or feet  rash, fever, and swollen lymph nodes  redness, blistering, peeling or loosening of the skin, including inside the mouth  suicidal thoughts, mood changes  uncontrollable head, mouth, neck, arm, or leg movements  unusual bruising or bleeding Side effects that usually do not require medical attention (report to your doctor or health care professional if they continue or are bothersome):  diarrhea  hair loss  irritable This list may not describe all possible side effects. Call your doctor for medical  advice about side effects. You may report side effects to FDA at 1-800-FDA-1088. Where should I keep my medicine? Keep out of the reach of children and pets. Store at room temperature up to 30 degrees C (86 degrees F). Protect from light. Get rid of any unused medicine after the expiration date. To get rid of medicines that are no longer needed or have expired:  Take the medicine to a medicine take-back program. Check with your pharmacy or law enforcement to find a location.  If you cannot return the medicine, check the label or package insert to see if the medicine should be thrown out in the garbage or flushed down the toilet. If you are not sure, ask your health care provider. If it is safe to put it in the trash, empty the medicine out of the container. Mix the medicine with cat litter, dirt, coffee grounds, or other unwanted substance. Seal the mixture in a bag or container. Put it in the trash. NOTE: This sheet is a summary. It may not cover all possible information. If you have questions about this medicine, talk to your doctor, pharmacist, or health care provider.  2021 Elsevier/Gold Standard (2019-06-11 15:07:49)

## 2020-04-24 LAB — CBC WITH DIFFERENTIAL/PLATELET
Absolute Monocytes: 392 cells/uL (ref 200–950)
Basophils Absolute: 18 cells/uL (ref 0–200)
Basophils Relative: 0.4 %
Eosinophils Absolute: 149 cells/uL (ref 15–500)
Eosinophils Relative: 3.3 %
HCT: 40.5 % (ref 35.0–45.0)
Hemoglobin: 12.7 g/dL (ref 11.7–15.5)
Lymphs Abs: 1800 cells/uL (ref 850–3900)
MCH: 24.7 pg — ABNORMAL LOW (ref 27.0–33.0)
MCHC: 31.4 g/dL — ABNORMAL LOW (ref 32.0–36.0)
MCV: 78.8 fL — ABNORMAL LOW (ref 80.0–100.0)
MPV: 10.5 fL (ref 7.5–12.5)
Monocytes Relative: 8.7 %
Neutro Abs: 2142 cells/uL (ref 1500–7800)
Neutrophils Relative %: 47.6 %
Platelets: 334 10*3/uL (ref 140–400)
RBC: 5.14 10*6/uL — ABNORMAL HIGH (ref 3.80–5.10)
RDW: 17 % — ABNORMAL HIGH (ref 11.0–15.0)
Total Lymphocyte: 40 %
WBC: 4.5 10*3/uL (ref 3.8–10.8)

## 2020-04-24 LAB — COMPLETE METABOLIC PANEL WITH GFR
AG Ratio: 1.2 (calc) (ref 1.0–2.5)
ALT: 22 U/L (ref 6–29)
AST: 24 U/L (ref 10–30)
Albumin: 3.8 g/dL (ref 3.6–5.1)
Alkaline phosphatase (APISO): 85 U/L (ref 31–125)
BUN: 15 mg/dL (ref 7–25)
CO2: 26 mmol/L (ref 20–32)
Calcium: 8.9 mg/dL (ref 8.6–10.2)
Chloride: 109 mmol/L (ref 98–110)
Creat: 0.7 mg/dL (ref 0.50–1.10)
GFR, Est African American: 126 mL/min/{1.73_m2} (ref >=60)
GFR, Est Non African American: 109 mL/min/{1.73_m2} (ref >=60)
Globulin: 3.3 g/dL (calc) (ref 1.9–3.7)
Glucose, Bld: 90 mg/dL (ref 65–99)
Potassium: 4.6 mmol/L (ref 3.5–5.3)
Sodium: 141 mmol/L (ref 135–146)
Total Bilirubin: 0.3 mg/dL (ref 0.2–1.2)
Total Protein: 7.1 g/dL (ref 6.1–8.1)

## 2020-04-24 LAB — C3 AND C4
C3 Complement: 167 mg/dL (ref 83–193)
C4 Complement: 32 mg/dL (ref 15–57)

## 2020-04-24 LAB — SJOGRENS SYNDROME-A EXTRACTABLE NUCLEAR ANTIBODY: SSA (Ro) (ENA) Antibody, IgG: <1 AI

## 2020-04-24 LAB — ANTI-DNA ANTIBODY, DOUBLE-STRANDED: ds DNA Ab: <1 IU/mL

## 2020-04-24 LAB — RNP ANTIBODY: Ribonucleic Protein(ENA) Antibody, IgG: >8 AI — AB

## 2020-04-24 LAB — ANTI-SCLERODERMA ANTIBODY: Scleroderma (Scl-70) (ENA) Antibody, IgG: <1 AI

## 2020-04-24 LAB — SJOGRENS SYNDROME-B EXTRACTABLE NUCLEAR ANTIBODY: SSB (La) (ENA) Antibody, IgG: <1 AI

## 2020-04-24 LAB — ANTI-SMITH ANTIBODY: ENA SM Ab Ser-aCnc: 4.7 AI — AB

## 2020-04-25 ENCOUNTER — Other Ambulatory Visit (HOSPITAL_COMMUNITY): Payer: Self-pay

## 2020-04-25 ENCOUNTER — Other Ambulatory Visit: Payer: Self-pay | Admitting: Internal Medicine

## 2020-04-25 ENCOUNTER — Telehealth: Payer: Self-pay | Admitting: Radiology

## 2020-04-25 DIAGNOSIS — M0580 Other rheumatoid arthritis with rheumatoid factor of unspecified site: Secondary | ICD-10-CM

## 2020-04-25 MED ORDER — HYDROXYCHLOROQUINE SULFATE 200 MG PO TABS
400.0000 mg | ORAL_TABLET | Freq: Every day | ORAL | 1 refills | Status: DC
  Filled 2020-04-25: qty 60, 30d supply, fill #0

## 2020-04-25 NOTE — Progress Notes (Signed)
I spoke with [name] about her results that does look like SLE fortunately no evidence of abnormal blood counts or kidney involvement. Discussed avoiding smoking, UV exposure, need to start taking her previously prescribed vitamin D supplement. Starting HCQ daily and has f/u in 8 weeks scheduled.

## 2020-04-25 NOTE — Telephone Encounter (Signed)
Error

## 2020-05-01 ENCOUNTER — Ambulatory Visit
Admission: RE | Admit: 2020-05-01 | Discharge: 2020-05-01 | Disposition: A | Discharge status: Home / Self Care | Payer: 59 | Source: Ambulatory Visit | Attending: Surgery | Admitting: Surgery

## 2020-05-01 DIAGNOSIS — R1909 Other intra-abdominal and pelvic swelling, mass and lump: Secondary | ICD-10-CM | POA: Diagnosis not present

## 2020-05-01 DIAGNOSIS — R222 Localized swelling, mass and lump, trunk: Secondary | ICD-10-CM

## 2020-05-01 MED ORDER — IOPAMIDOL (ISOVUE-370) INJECTION 76%
80.0000 mL | Freq: Once | INTRAVENOUS | Status: AC | PRN
  Administered 2020-05-01: 80 mL via INTRAVENOUS

## 2020-06-27 ENCOUNTER — Telehealth: Payer: Self-pay | Admitting: Radiology

## 2020-06-27 ENCOUNTER — Ambulatory Visit: Payer: 59 | Admitting: Internal Medicine

## 2020-06-27 ENCOUNTER — Other Ambulatory Visit: Payer: Self-pay

## 2020-06-27 ENCOUNTER — Encounter: Payer: Self-pay | Admitting: Internal Medicine

## 2020-06-27 VITALS — BP 138/90 | HR 92 | Ht 62.0 in | Wt 229.0 lb

## 2020-06-27 DIAGNOSIS — M0580 Other rheumatoid arthritis with rheumatoid factor of unspecified site: Secondary | ICD-10-CM

## 2020-06-27 DIAGNOSIS — Z79899 Other long term (current) drug therapy: Secondary | ICD-10-CM | POA: Diagnosis not present

## 2020-06-27 DIAGNOSIS — L819 Disorder of pigmentation, unspecified: Secondary | ICD-10-CM

## 2020-06-27 DIAGNOSIS — M79642 Pain in left hand: Secondary | ICD-10-CM

## 2020-06-27 DIAGNOSIS — M79641 Pain in right hand: Secondary | ICD-10-CM

## 2020-06-27 NOTE — Progress Notes (Signed)
Office Visit Note  Patient: [name]             Date of Birth: 1980/12/15           MRN: [id number]             PCP: Roseanna Rainbow, PA-C (Inactive) Referring: No ref. provider found Visit Date: 06/27/2020   Subjective:  Follow-up (Patient has been taking 200 mg of PLQ inconsistently. Patient feels as if she's lethargic while taking. Patient complains of continued bilateral hand pain and no improvement while taking PLQ. )   History of Present Illness: [name] is a 40 y.o. female here for follow up for newly diagnosed RA vs lupus with features of skin rashes, arthritis, Raynaud's phenomenon, and positive anti-Smith and anti-RNP antibodies also RF with trial of hydroxychloroquine since 2 months ago.  She reports that since starting the medicine she has not noticed any very large improvement in skin or joint symptoms.  She describes a feeling of being more lethargic when taking the medication and has not taken it with a high degree of consistency.  She still notices the skin hyperpigmentation changes around the eyes and hands most commonly in pain and proximal hand joint. She is accompanied by mother today.   Review of Systems  Constitutional:  Negative for fatigue.  HENT:  Negative for mouth sores, mouth dryness and nose dryness.   Eyes:  Negative for pain, itching, visual disturbance and dryness.  Respiratory:  Negative for cough, hemoptysis, shortness of breath and difficulty breathing.   Cardiovascular:  Negative for chest pain, palpitations and swelling in legs/feet.  Gastrointestinal:  Negative for abdominal pain, blood in stool, constipation and diarrhea.  Endocrine: Negative for increased urination.  Genitourinary:  Negative for painful urination.  Musculoskeletal:  Positive for joint pain, joint pain, joint swelling and morning stiffness. Negative for myalgias, muscle weakness, muscle tenderness and myalgias.  Skin:  Negative for color change, rash and  redness.  Allergic/Immunologic: Negative for susceptible to infections.  Neurological:  Positive for headaches. Negative for dizziness, numbness, memory loss and weakness.  Hematological:  Negative for swollen glands.  Psychiatric/Behavioral:  Negative for confusion and sleep disturbance.    PMFS History:  Patient Active Problem List   Diagnosis Date Noted   High risk medication use 06/27/2020   Morbid obesity with BMI of 40.0-44.9, adult (HCC) 04/17/2020   Anosmia 04/17/2020   Pain of breast 04/17/2020   Vitamin D deficiency 04/17/2020   Bilateral hand pain 04/17/2020   Polyarthritis with positive rheumatoid factor (HCC) 04/17/2020   Facial swelling 04/17/2020   Discoloration of skin of finger 04/17/2020    History reviewed. No pertinent past medical history.  Family History  Problem Relation Age of Onset   Diabetes Mother    Healthy Daughter    Healthy Daughter    Healthy Son    Past Surgical History:  Procedure Laterality Date   CESAREAN SECTION  2012   CESAREAN SECTION  2007   CESAREAN SECTION  2005   Social History   Social History Narrative   Not on file   Immunization History  Administered Date(s) Administered   Influenza Split 01/23/2014, 10/24/2019   PFIZER(Purple Top)SARS-COV-2 Vaccination 08/24/2019, 09/14/2019   Td 01/15/2003   Tdap 07/23/2009     Objective: Vital Signs: BP 138/90 (BP Location: Left Arm, Patient Position: Sitting, Cuff Size: Large)   Pulse 92   Ht 5\' 2"  (1.575 m)   Wt 229 lb (103.9 kg)  BMI 41.88 kg/m    Physical Exam Constitutional:      Appearance: She is obese.  Skin:    General: Skin is warm and dry.     Comments: Hyperpigmentation on the backs of both hands and and periorbital area  Neurological:     General: No focal deficit present.     Mental Status: She is alert.  Psychiatric:        Mood and Affect: Mood normal.     Musculoskeletal Exam:  Elbows full ROM no tenderness or swelling Wrists full ROM no tenderness  or swelling Tenderness to palpation around left thenar eminence and MCP joints bilaterally without synovitis and has full range of motion Knees full ROM no tenderness or swelling Ankles full ROM no tenderness or swelling  Investigation: No additional findings.  Imaging: No results found.  Recent Labs: Lab Results  Component Value Date   WBC 4.5 04/23/2020   HGB 12.7 04/23/2020   PLT 334 04/23/2020   NA 141 04/23/2020   K 4.6 04/23/2020   CL 109 04/23/2020   CO2 26 04/23/2020   GLUCOSE 90 04/23/2020   BUN 15 04/23/2020   CREATININE 0.70 04/23/2020   BILITOT 0.3 04/23/2020   AST 24 04/23/2020   ALT 22 04/23/2020   PROT 7.1 04/23/2020   CALCIUM 8.9 04/23/2020   GFRAA 126 04/23/2020    Speciality Comments: No specialty comments available.  Procedures:  No procedures performed Allergies: Patient has no known allergies.   Assessment / Plan:     Visit Diagnoses: Polyarthritis with positive rheumatoid factor (HCC)  Not much improvement since starting hydroxychloroquine.  Discussed alternative treatments would recommend trial of methotrexate for inflammatory arthritis and skin changes symptoms.  Discussed risk and benefits of medication and need for monitoring for cytopenia hepatotoxicity possible GI intolerance is in the oral medication no pre-existing history of pulmonary nodules or interstitial lung disease noted no prior history of viral hepatitis.  Bilateral hand pain Discoloration of skin of finger  Still having bilateral hand pain currently described over the MCP joints and left thenar eminence there is no palpable synovitis or visible changes on exam today.  She continues to report episodic discoloration of distal fingers with no ischemic changes on exam today.  High risk medication use - Plan: Hepatitis B core antibody, IgM, Hepatitis B surface antigen, Hepatitis C antibody  Anticipate starting methotrexate checking hepatitis serology baseline.  Orders: Orders  Placed This Encounter  Procedures   Hepatitis B core antibody, IgM   Hepatitis B surface antigen   Hepatitis C antibody    No orders of the defined types were placed in this encounter.    Follow-Up Instructions: Return in about 26 days (around 07/23/2020) for New mtx f/u 4wks.   Fuller Plan, MD  Note - This record has been created using [employer].  Chart creation errors have been sought, but may not always  have been located. Such creation errors do not reflect on  the standard of medical care.

## 2020-06-27 NOTE — Telephone Encounter (Signed)
Patient is scheduled for surgery (LAPAROSCOPIC VENTRAL HERNIA REPAIR WITH MESH) on 09/02/2020 with Dr. Bedelia Person at [facility]. With patient starting MTX, will she need to interrupt medication pre or post op?

## 2020-06-27 NOTE — Patient Instructions (Signed)
Methotrexate Tablets What is this medication? METHOTREXATE (METH oh TREX ate) treats inflammatory conditions such as arthritis and psoriasis. It works by decreasing inflammation, which can reduce pain and prevent long-term injury to the joints and skin. It may also be used to treat some types of cancer. It works by slowing down the growth of cancercells. This medicine may be used for other purposes; ask your health care provider orpharmacist if you have questions. COMMON BRAND NAME(S): Rheumatrex, Trexall What should I tell my care team before I take this medication? They need to know if you have any of these conditions: Fluid in the stomach area or lungs If you often drink alcohol Infection or immune system problems Kidney disease or on hemodialysis Liver disease Low blood counts, like low white cell, platelet, or red cell counts Lung disease Radiation therapy Stomach ulcers Ulcerative colitis An unusual or allergic reaction to methotrexate, other medications, foods, dyes, or preservatives Pregnant or trying to get pregnant Breast-feeding How should I use this medication? Take this medication by mouth with a glass of water. Follow the directions on the prescription label. Take your medication at regular intervals. Do not take it more often than directed. Do not stop taking except on your care team'sadvice. Make sure you know why you are taking this medication and how often you should take it. If this medication is used for a condition that is not cancer, like arthritis or psoriasis, it should be taken weekly, NOT daily. Taking thismedication more often than directed can cause serious side effects, even death. Talk to your care team about safe handling and disposal of this medication. Youmay need to take special precautions. Talk to your care team about the use of this medication in children. While thismedication may be prescribed for selected conditions, precautions do apply. Overdosage:  If you think you have taken too much of this medicine contact apoison control center or emergency room at once. NOTE: This medicine is only for you. Do not share this medicine with others. What if I miss a dose? If you miss a dose, talk with your care team. Do not take double or extra doses. What may interact with this medication? Do not take this medication with any of the following: Acitretin This medication may also interact with the following: Aspirin and aspirin-like medications including salicylates Azathioprine Certain antibiotics like penicillins, tetracycline, and chloramphenicol Certain medications that treat or prevent blood clots like warfarin, apixaban, dabigatran, and rivaroxaban Certain medications for stomach problems like esomeprazole, omeprazole, pantoprazole Cyclosporine Dapsone Diuretics Gold Hydroxychloroquine Live virus vaccines Medications for infection like acyclovir, adefovir, amphotericin B, bacitracin, cidofovir, foscarnet, ganciclovir, gentamicin, pentamidine, vancomycin Mercaptopurine NSAIDs, medications for pain and inflammation, like ibuprofen or naproxen Other cytotoxic agents Pamidronate Pemetrexed Penicillamine Phenylbutazone Phenytoin Probenecid Pyrimethamine Retinoids such as isotretinoin and tretinoin Steroid medications like prednisone or cortisone Sulfonamides like sulfasalazine and trimethoprim/sulfamethoxazole Theophylline Zoledronic acid This list may not describe all possible interactions. Give your health care provider a list of all the medicines, herbs, non-prescription drugs, or dietary supplements you use. Also tell them if you smoke, drink alcohol, or use illegaldrugs. Some items may interact with your medicine. What should I watch for while using this medication? Avoid alcoholic drinks. This medication can make you more sensitive to the sun. Keep out of the sun. If you cannot avoid being in the sun, wear protective clothing and  use sunscreen.Do not use sun lamps or tanning beds/booths. You may need blood work done while you are taking this medication.   Call your care team for advice if you get a fever, chills or sore throat, or other symptoms of a cold or flu. Do not treat yourself. This medication decreases your body's ability to fight infections. Try to avoid being aroundpeople who are sick. This medication may increase your risk to bruise or bleed. Call your care teamif you notice any unusual bleeding. Be careful brushing or flossing your teeth or using a toothpick because you may get an infection or bleed more easily. If you have any dental work done, Estate agent you are receiving this medication. Check with your care team if you get an attack of severe diarrhea, nausea and vomiting, or if you sweat a lot. The loss of too much body fluid can make itdangerous for you to take this medication. Talk to your care team about your risk of cancer. You may be more at risk forcertain types of cancers if you take this medication. Do not become pregnant while taking this medication or for 6 months after stopping it. Women should inform their care team if they wish to become pregnant or think they might be pregnant. Men should not father a child while taking this medication and for 3 months after stopping it. There is potential for serious harm to an unborn child. Talk to your care team for more information. Do not breast-feed an infant while taking this medication or for 1week after stopping it. This medication may make it more difficult to get pregnant or father a child.Talk to your care team if you are concerned about your fertility. What side effects may I notice from receiving this medication? Side effects that you should report to your care team as soon as possible: Allergic reactions-skin rash, itching, hives, swelling of the face, lips, tongue, or throat Blood clot-pain, swelling, or warmth in the leg, shortness of breath,  chest pain Dry cough, shortness of breath or trouble breathing Infection-fever, chills, cough, sore throat, wounds that don't heal, pain or trouble when passing urine, general feeling of discomfort or being unwell Kidney injury-decrease in the amount of urine, swelling of the ankles, hands, or feet Liver injury-right upper belly pain, loss of appetite, nausea, light-colored stool, dark yellow or brown urine, yellowing of the skin or eyes, unusual weakness or fatigue Low red blood cell count-unusual weakness or fatigue, dizziness, headache, trouble breathing Redness, blistering, peeling, or loosening of the skin, including inside the mouth Seizures Unusual bruising or bleeding Side effects that usually do not require medical attention (report to your careteam if they continue or are bothersome): Diarrhea Dizziness Hair loss Nausea Pain, redness, or swelling with sores inside the mouth or throat Vomiting This list may not describe all possible side effects. Call your doctor for medical advice about side effects. You may report side effects to FDA at1-800-FDA-1088. Where should I keep my medication? Keep out of the reach of children and pets. Store at room temperature between 20 and 25 degrees C (68 and 77 degrees F).Protect from light. Get rid of any unused medication after the expiration date. Talk to your care team about how to dispose of unused medication. Specialdirections may apply.

## 2020-06-27 NOTE — Telephone Encounter (Signed)
For an abdominal surgery she would probably need to discontinue the medicine for 2 weeks prior to be as safe as possible. If her surgeon has different recommendations could discuss that if needed.

## 2020-06-27 NOTE — Telephone Encounter (Signed)
Attempted to contact patient, LVM advising patient to call the office. 

## 2020-06-30 LAB — HEPATITIS B CORE ANTIBODY, IGM: Hep B C IgM: NONREACTIVE

## 2020-06-30 LAB — HEPATITIS C ANTIBODY
Hepatitis C Ab: NONREACTIVE
SIGNAL TO CUT-OFF: 0.01 (ref <1.00)

## 2020-06-30 LAB — HEPATITIS B SURFACE ANTIGEN: Hepatitis B Surface Ag: NONREACTIVE

## 2020-06-30 NOTE — Telephone Encounter (Signed)
Spoke with patient- after researching she's decided she would not like to take Methotrexate, she has re-started Plaquenil and plans to continue taking that instead.   Given patient's decision, does she still need to follow-up on 07/24/2020 or should we push her appointment out? Also, what would the recommendation be on holding PLQ prior to surgery?

## 2020-07-02 NOTE — Telephone Encounter (Signed)
Spoke with patient, changed 07/24/2020 appointment to 09/30/2020 since continuing the HCQ without a new medication change. Advised patient, per Dr. Dimple Casey, she does not need to interrupt hydroxychloroquine, this medicine does not increase risk of infection from surgery.

## 2020-07-15 DIAGNOSIS — Z09 Encounter for follow-up examination after completed treatment for conditions other than malignant neoplasm: Secondary | ICD-10-CM | POA: Diagnosis not present

## 2020-07-22 ENCOUNTER — Telehealth: Payer: Self-pay | Admitting: Radiology

## 2020-07-22 MED ORDER — FOLIC ACID 1 MG PO TABS
1.0000 mg | ORAL_TABLET | Freq: Every day | ORAL | 0 refills | Status: DC
  Filled 2020-07-24: qty 90, 90d supply, fill #0

## 2020-07-22 MED ORDER — METHOTREXATE 2.5 MG PO TABS
15.0000 mg | ORAL_TABLET | ORAL | 0 refills | Status: DC
  Filled 2020-07-24: qty 24, 28d supply, fill #0

## 2020-07-22 NOTE — Telephone Encounter (Signed)
I will send Rx to the pharmacy for her, she can start methotrexate after surgery as long as she is not on any antibiotics and surgeon is not worried about any open wound. She does not need to hold hydroxychloroquine for surgery this medicine is generally safe even for major procedures.

## 2020-07-22 NOTE — Telephone Encounter (Signed)
Spoke with patient, advised Dr. Dimple Casey will send Rx to the pharmacy for her, she can start methotrexate after surgery as long as she is not on any antibiotics and surgeon is not worried about any open wound.   She does not need to hold hydroxychloroquine for surgery this medicine is generally safe even for major procedures.

## 2020-07-22 NOTE — Telephone Encounter (Signed)
Spoke with patient, advised patient we will keep 09/30/2020 scheduled for follow-up. Patient would like medication sent to [facility], she will go ahead and pick up Rx and hold onto it to start after surgery.   Patient would also like to know if she should hold PLQ prior to or after surgery and if so for what amount of time? Please advise.

## 2020-07-22 NOTE — Telephone Encounter (Signed)
That sounds fine, we can keep her existing appointment scheduled for now may be at the right time for f/u afterwards.

## 2020-07-22 NOTE — Telephone Encounter (Signed)
Spoke with patient, she complains of increased symptoms. Patient has been taking Plaquenil off/on. Patient's surgery is scheduled for 09/02/2020, but patient would be interested in starting MTX after surgery after re-consideration.

## 2020-07-24 ENCOUNTER — Other Ambulatory Visit (HOSPITAL_COMMUNITY): Payer: Self-pay

## 2020-07-24 ENCOUNTER — Ambulatory Visit: Payer: 59 | Admitting: Internal Medicine

## 2020-07-24 ENCOUNTER — Other Ambulatory Visit: Payer: Self-pay | Admitting: Internal Medicine

## 2020-07-24 MED ORDER — ERGOCALCIFEROL 1.25 MG (50000 UT) PO CAPS
50000.0000 [IU] | ORAL_CAPSULE | ORAL | 0 refills | Status: AC
  Filled 2020-07-24: qty 8, 28d supply, fill #0
  Filled 2021-02-09: qty 24, 84d supply, fill #1

## 2020-07-25 ENCOUNTER — Other Ambulatory Visit (HOSPITAL_COMMUNITY): Payer: Self-pay

## 2020-07-28 ENCOUNTER — Other Ambulatory Visit (HOSPITAL_COMMUNITY): Payer: Self-pay

## 2020-07-28 MED FILL — Methotrexate Sodium Tab 2.5 MG (Base Equiv): ORAL | 84 days supply | Qty: 72 | Fill #0 | Status: AC

## 2020-08-25 ENCOUNTER — Ambulatory Visit: Payer: Self-pay | Admitting: Surgery

## 2020-08-25 ENCOUNTER — Other Ambulatory Visit (HOSPITAL_COMMUNITY): Payer: 59

## 2020-08-25 NOTE — Progress Notes (Signed)
Surgical Instructions   Your procedure is scheduled on Tuesday 09/02/2020.  Report to [facility] Main Entrance "A" at 05:30 A.M., then check in with the Admitting office.  Call [phone] if you have problems or questions between now and the morning of surgery:   Remember: Do not eat after midnight the night before your surgery  You may drink clear liquids until 04:30 the morning of your surgery.   Clear liquids allowed are: Water, Non-Citrus Juices (without pulp), Carbonated Beverages, Clear Tea, Black Coffee Only, and Gatorade    Do NOT take any medications the morning of surgery.  As of today, STOP taking any Aspirin (unless otherwise instructed by your surgeon) or Aspirin-containing products; NSAIDS - Aleve, Naproxen, Ibuprofen, Motrin, Advil, Goody's, BC's, all herbal medications, fish oil, and all vitamins.           Do not wear jewelry or makeup Do not wear lotions, powders, perfumes, or deodorant. Do not shave 48 hours prior to surgery.  Do not wear nail polish, gel polish, artificial nails, or any other type of covering on natural nails including fingernails and toenails. If patients have artificial nails, gel coating, etc. that need to be removed by a nail salon please have this removed prior to surgery or surgery may need to be canceled/delayed if the surgeon/ anesthesia feels like the patient is unable to be adequately monitored. Do not bring valuables to the hospital - [facility] is not responsible for any belongings or valuables.              Do NOT Smoke (Tobacco/Vaping) or drink Alcohol 24 hours prior to your procedure  If you use a CPAP at night, you may bring all equipment for your overnight stay.   Contacts, glasses, hearing aids, dentures or partials may not be worn into surgery, please bring cases for these belongings   For patients admitted to the hospital, discharge time will be determined by your treatment team.   Patients discharged the day of surgery  will not be allowed to drive home, and someone needs to stay with them for 24 hours.  ONLY ONE (1) SUPPORT PERSON MAY WAIT IN THE WAITING AREA WHILE YOU ARE IN SURGERY. NO VISITORS WILL BE ALLOWED IN PRE-OP WHERE PATIENTS GET READY FOR SURGERY.  TWO (2) VISITORS WILL BE ALLOWED IN YOUR ROOM IF YOU ARE ADMITTED AFTER SURGERY.  Minor children may have two parents present. Special consideration for safety and communication needs will be reviewed on a case by case basis.  Special instructions:    Oral Hygiene is also important to reduce your risk of infection.  Remember - BRUSH YOUR TEETH THE MORNING OF SURGERY WITH YOUR REGULAR TOOTHPASTE   [name]- Preparing For Surgery  Before surgery, you can play an important role. Because skin is not sterile, your skin needs to be as free of germs as possible. You can reduce the number of germs on your skin by washing with CHG (chlorahexidine gluconate) Soap before surgery.  CHG is an antiseptic cleaner which kills germs and bonds with the skin to continue killing germs even after washing.     Please do not use if you have an allergy to CHG or antibacterial soaps. If your skin becomes reddened/irritated stop using the CHG.  Do not shave (including legs and underarms) for at least 48 hours prior to first CHG shower. It is OK to shave your face.  Please follow these instructions carefully.     Shower the [employer]  BEFORE SURGERY and the MORNING OF SURGERY with CHG Soap.   If you chose to wash your hair, wash your hair first as usual with your normal shampoo. After you shampoo, rinse your hair and body thoroughly to remove the shampoo.    Then [employer] and genitals (private parts) with your normal soap and rinse thoroughly to remove soap.  Next use the CHG Soap as you would any other liquid soap. You can apply CHG directly to the skin and wash gently with a clean washcloth.   Apply the CHG Soap to your body ONLY FROM THE NECK DOWN.  Do not use on open  wounds or open sores. Avoid contact with your eyes, ears, mouth and genitals (private parts). Wash Face and genitals (private parts)  with your normal soap.   Wash thoroughly, paying special attention to the area where your surgery will be performed.  Thoroughly rinse your body with warm water from the neck down.  DO NOT shower/wash with your normal soap after using and rinsing off the CHG Soap.  Pat yourself dry with a CLEAN TOWEL.  Wear CLEAN PAJAMAS to bed the night before surgery  Place CLEAN SHEETS on your bed the night before your surgery  DO NOT SLEEP WITH PETS.   Day of Surgery:  Take a shower with CHG soap. Wear Clean/Comfortable clothing the morning of surgery Do not apply any deodorants/lotions.   Remember to brush your teeth WITH YOUR REGULAR TOOTHPASTE.   Please read over the following fact sheets that you were given.

## 2020-08-26 ENCOUNTER — Encounter (HOSPITAL_COMMUNITY): Payer: Self-pay

## 2020-08-26 ENCOUNTER — Encounter (HOSPITAL_COMMUNITY)
Admission: RE | Admit: 2020-08-26 | Discharge: 2020-08-26 | Disposition: A | Discharge status: Home / Self Care | Payer: 59 | Source: Ambulatory Visit | Attending: Surgery | Admitting: Surgery

## 2020-08-26 ENCOUNTER — Other Ambulatory Visit: Payer: Self-pay

## 2020-08-26 DIAGNOSIS — Z01812 Encounter for preprocedural laboratory examination: Secondary | ICD-10-CM | POA: Insufficient documentation

## 2020-08-26 LAB — CBC
HCT: 39.8 % (ref 36.0–46.0)
Hemoglobin: 12.4 g/dL (ref 12.0–15.0)
MCH: 24.8 pg — ABNORMAL LOW (ref 26.0–34.0)
MCHC: 31.2 g/dL (ref 30.0–36.0)
MCV: 79.4 fL — ABNORMAL LOW (ref 80.0–100.0)
Platelets: 302 10*3/uL (ref 150–400)
RBC: 5.01 MIL/uL (ref 3.87–5.11)
RDW: 17.4 % — ABNORMAL HIGH (ref 11.5–15.5)
WBC: 6.2 10*3/uL (ref 4.0–10.5)
nRBC: 0 % (ref 0.0–0.2)

## 2020-08-26 NOTE — Progress Notes (Signed)
Surgical Instructions   Your procedure is scheduled on Tuesday 09/02/2020.  Report to [facility] Main Entrance "A" at 05:30 A.M., then check in with the Admitting office.  Call [phone] if you have problems or questions between now and the morning of surgery:   Remember: Do not eat or drink after midnight the night before your surgery    Do NOT take any medications the morning of surgery.  As of today, STOP taking any Aspirin (unless otherwise instructed by your surgeon) or Aspirin-containing products; NSAIDS - Aleve, Naproxen, Ibuprofen, Motrin, Advil, Goody's, BC's, all herbal medications, fish oil, and all vitamins.           Do not wear jewelry or makeup Do not wear lotions, powders, perfumes, or deodorant. Do not shave 48 hours prior to surgery.  Do not wear nail polish, gel polish, artificial nails, or any other type of covering on natural nails including fingernails and toenails. If patients have artificial nails, gel coating, etc. that need to be removed by a nail salon please have this removed prior to surgery or surgery may need to be canceled/delayed if the surgeon/ anesthesia feels like the patient is unable to be adequately monitored. Do not bring valuables to the hospital - [facility] is not responsible for any belongings or valuables.              Do NOT Smoke (Tobacco/Vaping) or drink Alcohol 24 hours prior to your procedure  If you use a CPAP at night, you may bring all equipment for your overnight stay.   Contacts, glasses, hearing aids, dentures or partials may not be worn into surgery, please bring cases for these belongings   For patients admitted to the hospital, discharge time will be determined by your treatment team.   Patients discharged the day of surgery will not be allowed to drive home, and someone needs to stay with them for 24 hours.  ONLY ONE (1) SUPPORT PERSON MAY WAIT IN THE WAITING AREA WHILE YOU ARE IN SURGERY. NO VISITORS WILL BE ALLOWED  IN PRE-OP WHERE PATIENTS GET READY FOR SURGERY.  TWO (2) VISITORS WILL BE ALLOWED IN YOUR ROOM IF YOU ARE ADMITTED AFTER SURGERY.  Minor children may have two parents present. Special consideration for safety and communication needs will be reviewed on a case by case basis.  Special instructions:    Oral Hygiene is also important to reduce your risk of infection.  Remember - BRUSH YOUR TEETH THE MORNING OF SURGERY WITH YOUR REGULAR TOOTHPASTE   [facility]- Preparing For Surgery  Before surgery, you can play an important role. Because skin is not sterile, your skin needs to be as free of germs as possible. You can reduce the number of germs on your skin by washing with CHG (chlorahexidine gluconate) Soap before surgery.  CHG is an antiseptic cleaner which kills germs and bonds with the skin to continue killing germs even after washing.     Please do not use if you have an allergy to CHG or antibacterial soaps. If your skin becomes reddened/irritated stop using the CHG.  Do not shave (including legs and underarms) for at least 48 hours prior to first CHG shower. It is OK to shave your face.  Please follow these instructions carefully.     Shower the NIGHT BEFORE SURGERY and the MORNING OF SURGERY with CHG Soap.   If you chose to wash your hair, wash your hair first as usual with your normal shampoo. After you  shampoo, rinse your hair and body thoroughly to remove the shampoo.    Then [employer] and genitals (private parts) with your normal soap and rinse thoroughly to remove soap.  Next use the CHG Soap as you would any other liquid soap. You can apply CHG directly to the skin and wash gently with a clean washcloth.   Apply the CHG Soap to your body ONLY FROM THE NECK DOWN.  Do not use on open wounds or open sores. Avoid contact with your eyes, ears, mouth and genitals (private parts). Wash Face and genitals (private parts)  with your normal soap.   Wash thoroughly, paying special attention  to the area where your surgery will be performed.  Thoroughly rinse your body with warm water from the neck down.  DO NOT shower/wash with your normal soap after using and rinsing off the CHG Soap.  Pat yourself dry with a CLEAN TOWEL.  Wear CLEAN PAJAMAS to bed the night before surgery  Place CLEAN SHEETS on your bed the night before your surgery  DO NOT SLEEP WITH PETS.   Day of Surgery:  Take a shower with CHG soap. Wear Clean/Comfortable clothing the morning of surgery Do not apply any deodorants/lotions.   Remember to brush your teeth WITH YOUR REGULAR TOOTHPASTE.   Please read over the following fact sheets that you were given.

## 2020-08-26 NOTE — Progress Notes (Signed)
PCP - Silas Sacramento, FNP-CS Cardiologist - denies  PPM/ICD - denies Device Orders - N/A Rep Notified - N/A  Chest x-ray - N/A EKG - N/A Stress Test - denies ECHO - denies Cardiac Cath - denies  Sleep Study - denies CPAP - N/A  Fasting Blood Sugar - N/A  Blood Thinner Instructions: N/A  Aspirin Instructions: Patient was instructed: As of today, STOP taking any Aspirin (unless otherwise instructed by your surgeon) or Aspirin-containing products; NSAIDS - Aleve, Naproxen, Ibuprofen, Motrin, Advil, Goody's, BC's, all herbal medications, fish oil, and all vitamins.  ERAS Protcol - No  COVID TEST- No - ambulatory surgery   Anesthesia review: No  Patient denies shortness of breath, fever, cough and chest pain at PAT appointment   All instructions explained to the patient, with a verbal understanding of the material. Patient agrees to go over the instructions while at home for a better understanding. Patient also instructed to self quarantine after being tested for COVID-19. The opportunity to ask questions was provided.

## 2020-09-02 ENCOUNTER — Other Ambulatory Visit: Payer: Self-pay

## 2020-09-02 ENCOUNTER — Encounter (HOSPITAL_COMMUNITY): Payer: Self-pay | Admitting: Surgery

## 2020-09-02 ENCOUNTER — Ambulatory Visit (HOSPITAL_COMMUNITY)
Admission: RE | Admit: 2020-09-02 | Discharge: 2020-09-02 | Disposition: A | Discharge status: Home / Self Care | Payer: 59 | Attending: Surgery | Admitting: Surgery

## 2020-09-02 ENCOUNTER — Ambulatory Visit (HOSPITAL_COMMUNITY): Payer: 59 | Admitting: Certified Registered"

## 2020-09-02 ENCOUNTER — Other Ambulatory Visit (HOSPITAL_COMMUNITY): Payer: Self-pay

## 2020-09-02 ENCOUNTER — Encounter (HOSPITAL_COMMUNITY)
Admission: RE | Disposition: A | Discharge status: Home / Self Care | Payer: Self-pay | Source: Home / Self Care | Attending: Surgery

## 2020-09-02 DIAGNOSIS — Z98891 History of uterine scar from previous surgery: Secondary | ICD-10-CM | POA: Insufficient documentation

## 2020-09-02 DIAGNOSIS — K436 Other and unspecified ventral hernia with obstruction, without gangrene: Secondary | ICD-10-CM | POA: Insufficient documentation

## 2020-09-02 DIAGNOSIS — R430 Anosmia: Secondary | ICD-10-CM | POA: Diagnosis not present

## 2020-09-02 DIAGNOSIS — E559 Vitamin D deficiency, unspecified: Secondary | ICD-10-CM | POA: Diagnosis not present

## 2020-09-02 HISTORY — PX: INSERTION OF MESH: SHX5868

## 2020-09-02 HISTORY — PX: VENTRAL HERNIA REPAIR: SHX424

## 2020-09-02 SURGERY — REPAIR, HERNIA, VENTRAL, LAPAROSCOPIC
Anesthesia: General | Site: Abdomen

## 2020-09-02 MED ORDER — LIDOCAINE 2% (20 MG/ML) 5 ML SYRINGE
INTRAMUSCULAR | Status: AC
  Filled 2020-09-02: qty 5

## 2020-09-02 MED ORDER — PROPOFOL 10 MG/ML IV BOLUS
INTRAVENOUS | Status: DC | PRN
  Administered 2020-09-02: 200 mg via INTRAVENOUS

## 2020-09-02 MED ORDER — LIDOCAINE 2% (20 MG/ML) 5 ML SYRINGE
INTRAMUSCULAR | Status: DC | PRN
  Administered 2020-09-02: 100 mg via INTRAVENOUS

## 2020-09-02 MED ORDER — ACETAMINOPHEN 500 MG PO TABS: 1000.0000 mg | ORAL_TABLET | Freq: Once | ORAL | Status: DC

## 2020-09-02 MED ORDER — FENTANYL CITRATE (PF) 100 MCG/2ML IJ SOLN
25.0000 ug | INTRAMUSCULAR | Status: DC | PRN
  Administered 2020-09-02: 50 ug via INTRAVENOUS

## 2020-09-02 MED ORDER — DEXAMETHASONE SODIUM PHOSPHATE 10 MG/ML IJ SOLN
INTRAMUSCULAR | Status: AC
  Filled 2020-09-02: qty 1

## 2020-09-02 MED ORDER — SUCCINYLCHOLINE CHLORIDE 200 MG/10ML IV SOSY
PREFILLED_SYRINGE | INTRAVENOUS | Status: AC
  Filled 2020-09-02: qty 10

## 2020-09-02 MED ORDER — MIDAZOLAM HCL 5 MG/5ML IJ SOLN
INTRAMUSCULAR | Status: DC | PRN
  Administered 2020-09-02: 2 mg via INTRAVENOUS

## 2020-09-02 MED ORDER — CHLORHEXIDINE GLUCONATE CLOTH 2 % EX PADS: 6.0000 | MEDICATED_PAD | Freq: Once | CUTANEOUS | Status: DC

## 2020-09-02 MED ORDER — CHLORHEXIDINE GLUCONATE 0.12 % MT SOLN
15.0000 mL | Freq: Once | OROMUCOSAL | Status: AC
  Administered 2020-09-02: 15 mL via OROMUCOSAL
  Filled 2020-09-02: qty 15

## 2020-09-02 MED ORDER — PROPOFOL 10 MG/ML IV BOLUS
INTRAVENOUS | Status: AC
  Filled 2020-09-02: qty 20

## 2020-09-02 MED ORDER — LACTATED RINGERS IV SOLN: INTRAVENOUS | Status: DC

## 2020-09-02 MED ORDER — ONDANSETRON HCL 4 MG/2ML IJ SOLN
INTRAMUSCULAR | Status: AC
  Filled 2020-09-02: qty 2

## 2020-09-02 MED ORDER — METHOCARBAMOL 750 MG PO TABS
750.0000 mg | ORAL_TABLET | Freq: Four times a day (QID) | ORAL | 1 refills | Status: DC
  Filled 2020-09-02: qty 120, 30d supply, fill #0

## 2020-09-02 MED ORDER — ROCURONIUM BROMIDE 10 MG/ML (PF) SYRINGE
PREFILLED_SYRINGE | INTRAVENOUS | Status: AC
  Filled 2020-09-02: qty 10

## 2020-09-02 MED ORDER — DEXMEDETOMIDINE HCL IN NACL 200 MCG/50ML IV SOLN
INTRAVENOUS | Status: DC | PRN
  Administered 2020-09-02 (×4): 4 ug via INTRAVENOUS

## 2020-09-02 MED ORDER — DOCUSATE SODIUM 100 MG PO CAPS
100.0000 mg | ORAL_CAPSULE | Freq: Two times a day (BID) | ORAL | 2 refills | Status: AC
  Filled 2020-09-02: qty 60, 30d supply, fill #0

## 2020-09-02 MED ORDER — BUPIVACAINE-EPINEPHRINE (PF) 0.25% -1:200000 IJ SOLN
INTRAMUSCULAR | Status: AC
  Filled 2020-09-02: qty 30

## 2020-09-02 MED ORDER — IBUPROFEN 600 MG PO TABS
600.0000 mg | ORAL_TABLET | Freq: Four times a day (QID) | ORAL | 1 refills | Status: DC | PRN
  Filled 2020-09-02: qty 120, 30d supply, fill #0

## 2020-09-02 MED ORDER — FENTANYL CITRATE (PF) 100 MCG/2ML IJ SOLN
INTRAMUSCULAR | Status: AC
  Filled 2020-09-02: qty 2

## 2020-09-02 MED ORDER — OXYCODONE HCL 5 MG PO TABS
5.0000 mg | ORAL_TABLET | ORAL | 0 refills | Status: DC | PRN
  Filled 2020-09-02: qty 30, 5d supply, fill #0

## 2020-09-02 MED ORDER — PHENYLEPHRINE HCL (PRESSORS) 10 MG/ML IV SOLN
INTRAVENOUS | Status: DC | PRN
  Administered 2020-09-02 (×3): 80 ug via INTRAVENOUS

## 2020-09-02 MED ORDER — FENTANYL CITRATE (PF) 100 MCG/2ML IJ SOLN
INTRAMUSCULAR | Status: DC | PRN
  Administered 2020-09-02: 50 ug via INTRAVENOUS
  Administered 2020-09-02: 100 ug via INTRAVENOUS
  Administered 2020-09-02 (×2): 50 ug via INTRAVENOUS

## 2020-09-02 MED ORDER — 0.9 % SODIUM CHLORIDE (POUR BTL) OPTIME
TOPICAL | Status: DC | PRN
  Administered 2020-09-02: 1000 mL

## 2020-09-02 MED ORDER — BUPIVACAINE LIPOSOME 1.3 % IJ SUSP
20.0000 mL | Freq: Once | INTRAMUSCULAR | Status: DC
Start: 2020-09-02 — End: 2020-09-02

## 2020-09-02 MED ORDER — MIDAZOLAM HCL 2 MG/2ML IJ SOLN
INTRAMUSCULAR | Status: AC
  Filled 2020-09-02: qty 2

## 2020-09-02 MED ORDER — ROCURONIUM BROMIDE 10 MG/ML (PF) SYRINGE
PREFILLED_SYRINGE | INTRAVENOUS | Status: DC | PRN
  Administered 2020-09-02: 20 mg via INTRAVENOUS
  Administered 2020-09-02: 50 mg via INTRAVENOUS

## 2020-09-02 MED ORDER — FENTANYL CITRATE (PF) 250 MCG/5ML IJ SOLN
INTRAMUSCULAR | Status: AC
  Filled 2020-09-02: qty 5

## 2020-09-02 MED ORDER — ONDANSETRON HCL 4 MG/2ML IJ SOLN
INTRAMUSCULAR | Status: DC | PRN
  Administered 2020-09-02: 4 mg via INTRAVENOUS

## 2020-09-02 MED ORDER — AMISULPRIDE (ANTIEMETIC) 5 MG/2ML IV SOLN: 10.0000 mg | Freq: Once | INTRAVENOUS | Status: DC | PRN

## 2020-09-02 MED ORDER — DEXMEDETOMIDINE (PRECEDEX) IN NS 20 MCG/5ML (4 MCG/ML) IV SYRINGE
PREFILLED_SYRINGE | INTRAVENOUS | Status: AC
  Filled 2020-09-02: qty 5

## 2020-09-02 MED ORDER — ORAL CARE MOUTH RINSE: 15.0000 mL | Freq: Once | OROMUCOSAL | Status: AC

## 2020-09-02 MED ORDER — BUPIVACAINE-EPINEPHRINE 0.25% -1:200000 IJ SOLN
INTRAMUSCULAR | Status: DC | PRN
  Administered 2020-09-02: 10 mL

## 2020-09-02 MED ORDER — SUGAMMADEX SODIUM 200 MG/2ML IV SOLN
INTRAVENOUS | Status: DC | PRN
  Administered 2020-09-02: 200 mg via INTRAVENOUS

## 2020-09-02 MED ORDER — HEPARIN SODIUM (PORCINE) 5000 UNIT/ML IJ SOLN
5000.0000 [IU] | Freq: Once | INTRAMUSCULAR | Status: AC
  Administered 2020-09-02: 5000 [IU] via SUBCUTANEOUS
  Filled 2020-09-02: qty 1

## 2020-09-02 MED ORDER — DEXAMETHASONE SODIUM PHOSPHATE 10 MG/ML IJ SOLN
INTRAMUSCULAR | Status: DC | PRN
  Administered 2020-09-02: 10 mg via INTRAVENOUS

## 2020-09-02 MED ORDER — SCOPOLAMINE 1 MG/3DAYS TD PT72
MEDICATED_PATCH | TRANSDERMAL | Status: AC
  Filled 2020-09-02: qty 1

## 2020-09-02 MED ORDER — CEFAZOLIN SODIUM-DEXTROSE 2-4 GM/100ML-% IV SOLN
2.0000 g | INTRAVENOUS | Status: AC
  Administered 2020-09-02: 2 g via INTRAVENOUS
  Filled 2020-09-02: qty 100

## 2020-09-02 MED ORDER — SCOPOLAMINE 1 MG/3DAYS TD PT72
MEDICATED_PATCH | TRANSDERMAL | Status: DC | PRN
  Administered 2020-09-02: 1 via TRANSDERMAL

## 2020-09-02 SURGICAL SUPPLY — 46 items
BAG COUNTER SPONGE SURGICOUNT (BAG) ×2 IMPLANT
BINDER ABDOMINAL 12 ML 46-62 (SOFTGOODS) ×2 IMPLANT
CANISTER SUCT 3000ML PPV (MISCELLANEOUS) IMPLANT
CHLORAPREP W/TINT 26 (MISCELLANEOUS) ×2 IMPLANT
COVER SURGICAL LIGHT HANDLE (MISCELLANEOUS) ×2 IMPLANT
DERMABOND ADVANCED (GAUZE/BANDAGES/DRESSINGS) ×1
DERMABOND ADVANCED .7 DNX12 (GAUZE/BANDAGES/DRESSINGS) ×1 IMPLANT
DEVICE SECURE STRAP 25 ABSORB (INSTRUMENTS) ×2 IMPLANT
DEVICE TROCAR PUNCTURE CLOSURE (ENDOMECHANICALS) IMPLANT
ELECT CAUTERY BLADE 6.4 (BLADE) ×2 IMPLANT
ELECT REM PT RETURN 9FT ADLT (ELECTROSURGICAL) ×2
ELECTRODE REM PT RTRN 9FT ADLT (ELECTROSURGICAL) ×1 IMPLANT
GLOVE SURG ENC MOIS LTX SZ6.5 (GLOVE) ×2 IMPLANT
GLOVE SURG UNDER POLY LF SZ6 (GLOVE) ×2 IMPLANT
GOWN STRL REUS W/ TWL LRG LVL3 (GOWN DISPOSABLE) ×3 IMPLANT
GOWN STRL REUS W/TWL LRG LVL3 (GOWN DISPOSABLE) ×6
GRASPER SUT TROCAR 14GX15 (MISCELLANEOUS) ×2 IMPLANT
KIT BASIN OR (CUSTOM PROCEDURE TRAY) ×2 IMPLANT
KIT TURNOVER KIT B (KITS) ×2 IMPLANT
MARKER SKIN DUAL TIP RULER LAB (MISCELLANEOUS) ×4 IMPLANT
MESH VENTRALIGHT ST 4X6IN (Mesh General) ×2 IMPLANT
NEEDLE INSUFFLATION 14GA 120MM (NEEDLE) ×2 IMPLANT
NEEDLE SPNL 22GX3.5 QUINCKE BK (NEEDLE) IMPLANT
NS IRRIG 1000ML POUR BTL (IV SOLUTION) ×2 IMPLANT
PAD ARMBOARD 7.5X6 YLW CONV (MISCELLANEOUS) ×4 IMPLANT
PENCIL BUTTON HOLSTER BLD 10FT (ELECTRODE) ×2 IMPLANT
SCISSORS LAP 5X35 DISP (ENDOMECHANICALS) IMPLANT
SET IRRIG TUBING LAPAROSCOPIC (IRRIGATION / IRRIGATOR) IMPLANT
SET TUBE SMOKE EVAC HIGH FLOW (TUBING) ×2 IMPLANT
SHEARS HARMONIC ACE PLUS 36CM (ENDOMECHANICALS) IMPLANT
SLEEVE ENDOPATH XCEL 5M (ENDOMECHANICALS) ×4 IMPLANT
SUT ETHIBOND CT1 BRD #0 30IN (SUTURE) ×4 IMPLANT
SUT MNCRL AB 4-0 PS2 18 (SUTURE) ×2 IMPLANT
SUT PROLENE 0 CT 1 CR/8 (SUTURE) ×2 IMPLANT
SUT VIC AB 3-0 SH 27 (SUTURE) ×2
SUT VIC AB 3-0 SH 27XBRD (SUTURE) ×1 IMPLANT
TOWEL GREEN STERILE (TOWEL DISPOSABLE) ×2 IMPLANT
TOWEL GREEN STERILE FF (TOWEL DISPOSABLE) ×2 IMPLANT
TRAY FOLEY W/BAG SLVR 16FR (SET/KITS/TRAYS/PACK)
TRAY FOLEY W/BAG SLVR 16FR ST (SET/KITS/TRAYS/PACK) IMPLANT
TRAY LAPAROSCOPIC MC (CUSTOM PROCEDURE TRAY) ×2 IMPLANT
TROCAR XCEL BLADELESS 5X75MML (TROCAR) ×2 IMPLANT
TROCAR XCEL BLUNT TIP 100MML (ENDOMECHANICALS) IMPLANT
TROCAR XCEL NON-BLD 11X100MML (ENDOMECHANICALS) IMPLANT
TROCAR XCEL NON-BLD 5MMX100MML (ENDOMECHANICALS) ×2 IMPLANT
WATER STERILE IRR 1000ML POUR (IV SOLUTION) ×2 IMPLANT

## 2020-09-02 NOTE — Anesthesia Preprocedure Evaluation (Signed)
Anesthesia Evaluation  Patient identified by MRN, date of birth, ID band Patient awake    Reviewed: Allergy & Precautions, NPO status , Patient's Chart, lab work & pertinent test results  Airway Mallampati: II  TM Distance: >3 FB Neck ROM: Full    Dental  (+) Dental Advisory Given   Pulmonary neg pulmonary ROS,    breath sounds clear to auscultation       Cardiovascular negative cardio ROS   Rhythm:Regular Rate:Normal     Neuro/Psych negative neurological ROS     GI/Hepatic Neg liver ROS, Incarcerated ventral hernia    Endo/Other  Morbid obesity  Renal/GU negative Renal ROS     Musculoskeletal   Abdominal   Peds  Hematology negative hematology ROS (+)   Anesthesia Other Findings   Reproductive/Obstetrics                             Lab Results  Component Value Date   WBC 6.2 08/26/2020   HGB 12.4 08/26/2020   HCT 39.8 08/26/2020   MCV 79.4 (L) 08/26/2020   PLT 302 08/26/2020   Lab Results  Component Value Date   CREATININE 0.70 04/23/2020   BUN 15 04/23/2020   NA 141 04/23/2020   K 4.6 04/23/2020   CL 109 04/23/2020   CO2 26 04/23/2020    Anesthesia Physical Anesthesia Plan  ASA: 3  Anesthesia Plan: General   Post-op Pain Management:    Induction: Intravenous  PONV Risk Score and Plan: 4 or greater and Scopolamine patch - Pre-op, Midazolam, Dexamethasone, Ondansetron and Treatment may vary due to age or medical condition  Airway Management Planned: Oral ETT  Additional Equipment: None  Intra-op Plan:   Post-operative Plan: Extubation in OR  Informed Consent: I have reviewed the patients History and Physical, chart, labs and discussed the procedure including the risks, benefits and alternatives for the proposed anesthesia with the patient or authorized representative who has indicated his/her understanding and acceptance.     Dental advisory given  Plan  Discussed with: CRNA  Anesthesia Plan Comments:         Anesthesia Quick Evaluation

## 2020-09-02 NOTE — Transfer of Care (Signed)
Immediate Anesthesia Transfer of Care Note  Patient: [name]  Procedure(s) Performed: LAPAROSCOPIC VENTRAL HERNIA REPAIR (Abdomen) INSERTION OF MESH (Abdomen)  Patient Location: PACU  Anesthesia Type:General  Level of Consciousness: awake, alert , oriented and patient cooperative  Airway & Oxygen Therapy: Patient Spontanous Breathing and Patient connected to face mask oxygen  Post-op Assessment: Report given to RN, Post -op Vital signs reviewed and stable and Patient moving all extremities X 4  Post vital signs: Reviewed and stable  Last Vitals:  Vitals Value Taken Time  BP 144/95 09/02/20 0918  Temp    Pulse 83 09/02/20 0919  Resp 27 09/02/20 0919  SpO2 98 % 09/02/20 0919  Vitals shown include unvalidated device data.  Last Pain:  Vitals:   09/02/20 0918  TempSrc:   PainSc: 0-No pain         Complications: No notable events documented.

## 2020-09-02 NOTE — Op Note (Signed)
    Operative Note     Date: 09/02/2020   Procedure: laparoscopic ventral hernia repair with mesh   Pre-op diagnosis: incarcerated ventral hernia Post-op diagnosis: same   Indication and clinical history: 43F with incarcerated ventral hernia     Surgeon: Diamantina Monks, MD   Anesthesiologist: Glade Stanford, MD Anesthesia: General   Findings:  Specimen: none EBL: <5cc Drains/Implants: 10x15cm ventralight permanent mesh   Disposition: PACU - hemodynamically stable.   Description of procedure: The patient was positioned supine on the operating room table. General anesthetic induction and intubation were uneventful. Time-out was performed verifying correct patient, procedure, signature of informed consent, and administration of pre-operative antibiotics. The abdomen was prepped and draped in the usual sterile fashion.    A Veress needle was used in the left upper quadrant to insufflate the abdomen and the abdomen entered using a 81mm port and an optiview technique. No injury to any intra-abdominal structure was identified. Two additional 58mm ports were placed in the left abdomen under direct visualization and after administration of local anesthetic. A 10x15cm oval mesh was labeled in four quadrants, #1 prolene suture secured to it in four quadrants, and was inserted into the abdomen. After orientation in the abdomen, a suture passer was used to secure the mesh in four quadrants and a 5mm absorbable tacker used to circumferentially secure it. The skin of all port sites were closed with 4-0 monocryl suture. All wounds were dressed with dermabond as sterile dressing.    All sponge and instrument counts were correct at the conclusion of the procedure. The patient was awakened from anesthesia, extubated uneventfully, and transported to the PACU in good condition. There were no complications.        Diamantina Monks, MD General and Trauma Surgery [facility]

## 2020-09-02 NOTE — Anesthesia Procedure Notes (Signed)
Procedure Name: Intubation Date/Time: 09/02/2020 7:45 AM Performed by: Annamary Carolin, CRNA Pre-anesthesia Checklist: Patient identified, Emergency Drugs available, Suction available and Patient being monitored Patient Re-evaluated:Patient Re-evaluated prior to induction Oxygen Delivery Method: Circle System Utilized Preoxygenation: Pre-oxygenation with 100% oxygen Induction Type: IV induction Ventilation: Mask ventilation without difficulty Laryngoscope Size: 3 and Mac Grade View: Grade I Tube type: Oral Number of attempts: 1 Airway Equipment and Method: Stylet and Oral airway Placement Confirmation: ETT inserted through vocal cords under direct vision, positive ETCO2 and breath sounds checked- equal and bilateral Tube secured with: Tape Dental Injury: Teeth and Oropharynx as per pre-operative assessment  Comments: With ease

## 2020-09-02 NOTE — Anesthesia Postprocedure Evaluation (Signed)
Anesthesia Post Note  Patient: [name]  Procedure(s) Performed: LAPAROSCOPIC VENTRAL HERNIA REPAIR (Abdomen) INSERTION OF MESH (Abdomen)     Patient location during evaluation: PACU Anesthesia Type: General Level of consciousness: awake and alert Pain management: pain level controlled Vital Signs Assessment: post-procedure vital signs reviewed and stable Respiratory status: spontaneous breathing, nonlabored ventilation, respiratory function stable and patient connected to nasal cannula oxygen Cardiovascular status: blood pressure returned to baseline and stable Postop Assessment: no apparent nausea or vomiting Anesthetic complications: no   No notable events documented.  Last Vitals:  Vitals:   09/02/20 0930 09/02/20 0945  BP: (!) 145/94 (!) 148/89  Pulse: 86 74  Resp: 17 (!) 22  Temp:  36.4 C  SpO2: 95% 95%    Last Pain:  Vitals:   09/02/20 0945  TempSrc:   PainSc: 0-No pain                 Kennieth Rad

## 2020-09-02 NOTE — Discharge Instructions (Signed)
May shower beginning 09/03/2020. Do not peel off or scrub skin glue. May allow warm soapy water to run over incision, then rinse and pat dry. Do not soak in any water (tubs, hot tubs, pools, lakes, oceans) for one week.   No lifting greater than 5 pounds for six weeks.   Pain regimen: take over-the-counter tylenol (acetaminophen) 1000mg  every six hours, the prescription ibuprofen (600mg ) every six hours and the robaxin (methocarbamol) 750mg  every six hours. With all three of these, you should be taking something every two hours. Example: tylenol ( acetaminophen) at 8am, ibuprofen at 10am, robaxin (methocarbamol) at 12pm, tylenol (acetaminophen) again at 2pm, ibuprofen again at 4pm, robaxin (methocarbamol) at 6pm. You also have a prescription for oxycodone, which should be taken if the tylenol (acetaminophen), ibuprofen, and robaxin (methocarbamol) are not enough to control your pain. You may take the oxycodone as frequently as every four hours as needed, but if you are taking the other medications as above, you should not need the oxycodone this frequently. You have also been given a prescription for colace (docusate) which is a stool softener. Please take this as prescribed because the oxycodone can cause constipation and the colace (docusate) will minimize or prevent constipation.  Call the office at [phone] for temperature greater than 101.12F, worsening pain, redness or warmth at the incision site.  Please call [phone] to make an appointment for 2-3 weeks after surgery for wound check.

## 2020-09-02 NOTE — H&P (Signed)
    [name] is an 40 y.o. female.   HPI: 37F with ventral hernia. The patient has had no hospitalizations, ER visits, surgeries, or newly diagnosed allergies since being seen in the office. Patient sees a rheumatologist regularly and has been seen since the last office visit, started on hydroxychloroquine for management of possible lupus.    History reviewed. No pertinent past medical history.  Past Surgical History:  Procedure Laterality Date   CESAREAN SECTION  2012   CESAREAN SECTION  2007   CESAREAN SECTION  2005    Family History  Problem Relation Age of Onset   Diabetes Mother    Healthy Daughter    Healthy Daughter    Healthy Son     Social History:  reports that she has never smoked. She has never used smokeless tobacco. She reports current alcohol use. She reports that she does not currently use drugs.  Allergies: No Known Allergies  Medications: I have reviewed the patient's current medications.  No results found for this or any previous visit (from the past 48 hour(s)).  No results found.  ROS 10 point review of systems is negative except as listed above in HPI.   Physical Exam Blood pressure (!) 148/86, pulse 100, temperature 98.3 F (36.8 C), temperature source Oral, resp. rate 18, last menstrual period 09/01/2020, SpO2 100 %. Constitutional: well-developed, well-nourished HEENT: pupils equal, round, reactive to light, 34mm b/l, moist conjunctiva, external inspection of ears and nose normal, hearing intact Oropharynx: normal oropharyngeal mucosa, normal dentition Neck: no thyromegaly, trachea midline, no midline cervical tenderness to palpation Chest: breath sounds equal bilaterally, normal respiratory effort, no midline or lateral chest wall tenderness to palpation/deformity Abdomen: soft, NT, supraumbilical ventral hernia, no bruising, no hepatosplenomegaly GU: normal female genitalia  Back: no wounds, no thoracic/lumbar spine tenderness to  palpation, no thoracic/lumbar spine stepoffs Rectal: deferred Extremities: 2+ radial and pedal pulses bilaterally, intact motor and sensation bilateral UE and LE, no peripheral edema MSK: unable to assess gait/station, no clubbing/cyanosis of fingers/toes, normal ROM of all four extremities, weakness of b/l UE-stable Skin: warm, dry, no rashes Psych: normal memory, normal mood/affect    Assessment/Plan: 37F with small ventral ventral, plan for lapVHR. Informed consent was obtained after detailed explanation of risks, including bleeding, infection, hematoma/seroma, temporary or permanent neuropathy, hernia recurrence, and mesh infection requiring explantation. All questions answered to the patient's satisfaction.   Diamantina Monks, MD General and Trauma Surgery [facility]

## 2020-09-03 ENCOUNTER — Encounter (HOSPITAL_COMMUNITY): Payer: Self-pay | Admitting: Surgery

## 2020-09-18 DIAGNOSIS — R0602 Shortness of breath: Secondary | ICD-10-CM | POA: Diagnosis not present

## 2020-09-18 DIAGNOSIS — M542 Cervicalgia: Secondary | ICD-10-CM | POA: Diagnosis not present

## 2020-09-18 DIAGNOSIS — J984 Other disorders of lung: Secondary | ICD-10-CM | POA: Diagnosis not present

## 2020-09-18 DIAGNOSIS — R000 Tachycardia, unspecified: Secondary | ICD-10-CM | POA: Diagnosis not present

## 2020-09-18 DIAGNOSIS — R0789 Other chest pain: Secondary | ICD-10-CM | POA: Diagnosis not present

## 2020-09-18 DIAGNOSIS — R918 Other nonspecific abnormal finding of lung field: Secondary | ICD-10-CM | POA: Diagnosis not present

## 2020-09-18 DIAGNOSIS — R079 Chest pain, unspecified: Secondary | ICD-10-CM | POA: Diagnosis not present

## 2020-09-19 DIAGNOSIS — J984 Other disorders of lung: Secondary | ICD-10-CM | POA: Diagnosis not present

## 2020-09-19 DIAGNOSIS — R079 Chest pain, unspecified: Secondary | ICD-10-CM | POA: Diagnosis not present

## 2020-09-19 DIAGNOSIS — R000 Tachycardia, unspecified: Secondary | ICD-10-CM | POA: Diagnosis not present

## 2020-09-19 DIAGNOSIS — R918 Other nonspecific abnormal finding of lung field: Secondary | ICD-10-CM | POA: Diagnosis not present

## 2020-09-19 DIAGNOSIS — M542 Cervicalgia: Secondary | ICD-10-CM | POA: Diagnosis not present

## 2020-09-29 NOTE — Progress Notes (Signed)
Office Visit Note  Patient: [name]             Date of Birth: [birth year]           MRN: [id number]             PCP: [facility] Referring: No ref. provider found Visit Date: 09/30/2020   Subjective:   History of Present Illness: [name] is a 40 y.o. female here for follow up for seropositive RA after starting methotrexate 15 mg PO weekly about 1 month ago due to lack of improvement with hydroxychloroquine. She is not sure about this medication since she felt joint pain was already improving somewhat on its own, possibly from not working and so using her joints less. She has some skin rash above the left eye and noticed darkening with some peeling on the palms of both hands.  Previous HPI 06/27/20 [name] is a 40 y.o. female here for follow up for newly diagnosed RA vs lupus with features of skin rashes, arthritis, Raynaud's phenomenon, and positive anti-Smith and anti-RNP antibodies also RF with trial of hydroxychloroquine since 2 months ago.  She reports that since starting the medicine she has not noticed any very large improvement in skin or joint symptoms.  She describes a feeling of being more lethargic when taking the medication and has not taken it with a high degree of consistency.  She still notices the skin hyperpigmentation changes around the eyes and hands most commonly in pain and proximal hand joint. She is accompanied by mother today.  04/17/20 [name] is a 40 y.o. female here for evaluation of bilateral hand pain, swelling and positive rheumatoid factor Abs. Left hip pain started about 2 months ago with no associated other problems at the time. She took a short duration of steroids with resolution of symptoms. After finishing that medicine she has noticed hand pain and numbness worst on the left but involving both hands. She does not recall any medical events or changes except for COVID vaccine in the  preceding period. She notices fingers turning blue at end of work shift, not particularly correlated with temperature or activity. She tried wearing brace on left wrist and started using right hand more this has helped the left hand partially. Since about 1 month ago noticing dark discoloration around eyes and facial swelling, also linear rashes on the backs of hands.  Review of Systems  Constitutional:  Negative for fatigue.  HENT:  Negative for mouth sores, mouth dryness and nose dryness.   Eyes:  Negative for pain, itching and dryness.  Respiratory:  Negative for shortness of breath and difficulty breathing.   Cardiovascular:  Negative for chest pain and palpitations.  Gastrointestinal:  Negative for blood in stool, constipation and diarrhea.  Endocrine: Negative for increased urination.  Genitourinary:  Negative for difficulty urinating.  Musculoskeletal:  Positive for joint pain and joint pain. Negative for joint swelling, myalgias, morning stiffness, muscle tenderness and myalgias.  Skin:  Negative for color change, rash and redness.  Allergic/Immunologic: Negative for susceptible to infections.  Neurological:  Negative for dizziness, numbness, headaches, memory loss and weakness.  Hematological:  Negative for bruising/bleeding tendency.  Psychiatric/Behavioral:  Negative for confusion.    PMFS History:  Patient Active Problem List   Diagnosis Date Noted   High risk medication use 06/27/2020   Morbid obesity with BMI of 40.0-44.9, adult (HCC) 04/17/2020   Anosmia 04/17/2020   Pain of  breast 04/17/2020   Vitamin D deficiency 04/17/2020   Bilateral hand pain 04/17/2020   Polyarthritis with positive rheumatoid factor (HCC) 04/17/2020   Facial swelling 04/17/2020   Discoloration of skin of finger 04/17/2020    History reviewed. No pertinent past medical history.  Family History  Problem Relation Age of Onset   Diabetes Mother    Healthy Daughter    Healthy Daughter    Healthy  Son    Past Surgical History:  Procedure Laterality Date   CESAREAN SECTION  2012   CESAREAN SECTION  2007   CESAREAN SECTION  2005   INSERTION OF MESH N/A 09/02/2020   Procedure: INSERTION OF MESH;  Surgeon: Diamantina Monks, MD;  Location: [facility] OR;  Service: General;  Laterality: N/A;   VENTRAL HERNIA REPAIR N/A 09/02/2020   Procedure: LAPAROSCOPIC VENTRAL HERNIA REPAIR;  Surgeon: Diamantina Monks, MD;  Location: [facility] OR;  Service: General;  Laterality: N/A;   Social History   Social History Narrative   Not on file   Immunization History  Administered Date(s) Administered   Influenza Split 01/23/2014, 10/24/2019   PFIZER(Purple Top)SARS-COV-2 Vaccination 08/24/2019, 09/14/2019   Td 01/15/2003   Tdap 07/23/2009     Objective: Vital Signs: BP (!) 151/104 (BP Location: Left Arm, Patient Position: Sitting, Cuff Size: Large)   Pulse 83   Ht 5\' 2"  (1.575 m)   Wt 220 lb (99.8 kg)   LMP 09/01/2020   BMI 40.24 kg/m    Physical Exam Cardiovascular:     Rate and Rhythm: Normal rate and regular rhythm.  Pulmonary:     Effort: Pulmonary effort is normal.     Breath sounds: Normal breath sounds.  Skin:    General: Skin is warm and dry.     Comments: Mild hyperpigmentation in area of central palm bilaterally  Neurological:     Mental Status: She is alert.     Musculoskeletal Exam:  Neck full ROM no tenderness Shoulders full ROM no tenderness or swelling Elbows full ROM no tenderness or swelling Wrists full ROM no tenderness or swelling Fingers full ROM no tenderness or swelling Knees full ROM no tenderness or swelling  Investigation: No additional findings.  Imaging: No results found.  Recent Labs: Lab Results  Component Value Date   WBC 6.2 08/26/2020   HGB 12.4 08/26/2020   PLT 302 08/26/2020   NA 141 04/23/2020   K 4.6 04/23/2020   CL 109 04/23/2020   CO2 26 04/23/2020   GLUCOSE 90 04/23/2020   BUN 15 04/23/2020   CREATININE 0.70 04/23/2020   BILITOT 0.3  04/23/2020   AST 24 04/23/2020   ALT 22 04/23/2020   PROT 7.1 04/23/2020   CALCIUM 8.9 04/23/2020   GFRAA 126 04/23/2020    Speciality Comments: No specialty comments available.  Procedures:  No procedures performed Allergies: Patient has no known allergies.   Assessment / Plan:     Visit Diagnoses: Polyarthritis with positive rheumatoid factor (HCC)  Symptoms currently improved off treatment, maybe use related. She has no evidence of erosive or deforming disease so no risk in monitoring for now. She has methotrexate and folic acid at home already, I recommended if symptoms return again can call in to discuss starting and arrange f/u for this otherwise observation.   Orders: No orders of the defined types were placed in this encounter.  No orders of the defined types were placed in this encounter.    Follow-Up Instructions: Return in about 6 months (around  03/30/2021) for ?RA obs f/u 51mos.   Fuller Plan, MD  Note - This record has been created using [employer].  Chart creation errors have been sought, but may not always  have been located. Such creation errors do not reflect on  the standard of medical care.

## 2020-09-30 ENCOUNTER — Ambulatory Visit (INDEPENDENT_AMBULATORY_CARE_PROVIDER_SITE_OTHER): Payer: 59 | Admitting: Internal Medicine

## 2020-09-30 ENCOUNTER — Encounter: Payer: Self-pay | Admitting: Internal Medicine

## 2020-09-30 ENCOUNTER — Other Ambulatory Visit: Payer: Self-pay

## 2020-09-30 VITALS — BP 151/104 | HR 83 | Ht 62.0 in | Wt 220.0 lb

## 2020-09-30 DIAGNOSIS — M0580 Other rheumatoid arthritis with rheumatoid factor of unspecified site: Secondary | ICD-10-CM

## 2020-09-30 DIAGNOSIS — M79641 Pain in right hand: Secondary | ICD-10-CM

## 2020-09-30 DIAGNOSIS — M79642 Pain in left hand: Secondary | ICD-10-CM

## 2021-02-09 ENCOUNTER — Other Ambulatory Visit (HOSPITAL_COMMUNITY): Payer: Self-pay

## 2021-03-24 DIAGNOSIS — Z131 Encounter for screening for diabetes mellitus: Secondary | ICD-10-CM | POA: Diagnosis not present

## 2021-03-24 DIAGNOSIS — Z23 Encounter for immunization: Secondary | ICD-10-CM | POA: Diagnosis not present

## 2021-03-24 DIAGNOSIS — Z13220 Encounter for screening for lipoid disorders: Secondary | ICD-10-CM | POA: Diagnosis not present

## 2021-03-24 DIAGNOSIS — E559 Vitamin D deficiency, unspecified: Secondary | ICD-10-CM | POA: Diagnosis not present

## 2021-03-24 DIAGNOSIS — Z0000 Encounter for general adult medical examination without abnormal findings: Secondary | ICD-10-CM | POA: Diagnosis not present

## 2021-03-27 ENCOUNTER — Ambulatory Visit: Payer: 59 | Admitting: Internal Medicine

## 2021-05-06 NOTE — Progress Notes (Signed)
? ?Office Visit Note ? ?Patient: [name]             ?Date of Birth: [birth year]           ?MRN: [id number]             ?PCP: [facility] ?Referring: [facility]* ?Visit Date: 05/07/2021 ? ? ?Subjective:  ? ?History of Present Illness: [name] is Marissa 41 y.o. female here for follow up with previous inflammatory joint pain and positive RF, was doing well off treatment.  However since she is back to working more intense work schedule, for example third shift up to 12 or 14 hours at Marissa time symptoms are getting worse again.  She has joint pain and stiffness affecting bilateral shoulders wrists and knees and having bluish discoloration in her fingertips again.  She is seeing some worsening of hyperpigmented skin rashes on her hands and arms as well.  Skin is extremely itchy but she is not sure whether this is just from very frequent handwashing required for her work. ? ?Previous HPI ?09/30/20 ?AHLIYAH [name] is Marissa 41 y.o. female here for follow up for seropositive RA after starting methotrexate 15 mg PO weekly about 1 month ago due to lack of improvement with hydroxychloroquine. She is not sure about this medication since she felt joint pain was already improving somewhat on its own, possibly from not working and so using her joints less. She has some skin rash above the left eye and noticed darkening with some peeling on the palms of both hands. ?  ?Previous HPI  ?04/17/20 ?[name] is Marissa 41 y.o. female here for evaluation of bilateral hand pain, swelling and positive rheumatoid factor Abs. Left hip pain started about 2 months ago with no associated other problems at the time. She took Marissa short duration of steroids with resolution of symptoms. After finishing that medicine she has noticed hand pain and numbness worst on the left but involving both hands. She does not recall any medical events or changes except for COVID vaccine in the preceding period. She  notices fingers turning blue at end of work shift, not particularly correlated with temperature or activity. She tried wearing brace on left wrist and started using right hand more this has helped the left hand partially. ?Since about 1 month ago noticing dark discoloration around eyes and facial swelling, also linear rashes on the backs of hands. ? ? ?Review of Systems  ?Constitutional:  Negative for fatigue.  ?HENT:  Negative for mouth sores, mouth dryness and nose dryness.   ?Eyes:  Negative for pain, itching and dryness.  ?Respiratory:  Negative for shortness of breath and difficulty breathing.   ?Cardiovascular:  Negative for chest pain and palpitations.  ?Gastrointestinal:  Negative for blood in stool, constipation and diarrhea.  ?Endocrine: Negative for increased urination.  ?Genitourinary:  Negative for difficulty urinating.  ?Musculoskeletal:  Positive for joint pain, joint pain, joint swelling, myalgias, muscle weakness, morning stiffness, muscle tenderness and myalgias.  ?Skin:  Positive for color change and rash.  ?Allergic/Immunologic: Negative for susceptible to infections.  ?Neurological:  Positive for numbness and headaches. Negative for dizziness, memory loss and weakness.  ?Hematological:  Negative for bruising/bleeding tendency.  ?Psychiatric/Behavioral:  Negative for confusion.   ? ?PMFS History:  ?Patient Active Problem List  ? Diagnosis Date Noted  ? High risk medication use 06/27/2020  ? Morbid obesity with BMI of 40.0-44.9, adult (HCC) 04/17/2020  ?  Anosmia 04/17/2020  ? Pain of breast 04/17/2020  ? Vitamin D deficiency 04/17/2020  ? Bilateral hand pain 04/17/2020  ? Polyarthritis with positive rheumatoid factor (HCC) 04/17/2020  ? Facial swelling 04/17/2020  ? Discoloration of skin of finger 04/17/2020  ?  ?History reviewed. No pertinent past medical history.  ?Family History  ?Problem Relation Age of Onset  ? Diabetes Mother   ? Healthy Daughter   ? Healthy Daughter   ? Healthy Son    ? ?Past Surgical History:  ?Procedure Laterality Date  ? CESAREAN SECTION  2012  ? CESAREAN SECTION  2007  ? CESAREAN SECTION  2005  ? INSERTION OF MESH N/Marissa 09/02/2020  ? Procedure: INSERTION OF MESH;  Surgeon: Diamantina Monks, MD;  Location: [facility] OR;  Service: General;  Laterality: N/Marissa;  ? VENTRAL HERNIA REPAIR N/Marissa 09/02/2020  ? Procedure: LAPAROSCOPIC VENTRAL HERNIA REPAIR;  Surgeon: Diamantina Monks, MD;  Location: [facility] OR;  Service: General;  Laterality: N/Marissa;  ? ?Social History  ? ?Social History Narrative  ? Not on file  ? ?Immunization History  ?Administered Date(s) Administered  ? Influenza Split 01/23/2014, 10/24/2019  ? PFIZER(Purple Top)SARS-COV-2 Vaccination 08/24/2019, 09/14/2019  ? Td 01/15/2003  ? Tdap 07/23/2009  ?  ? ?Objective: ?Vital Signs: BP (!) 144/102 (BP Location: Left Arm, Patient Position: Sitting, Cuff Size: Large)   Pulse 97   Ht 5\' 2"  (1.575 m)   Wt 219 lb 3.2 oz (99.4 kg)   BMI 40.09 kg/m?   ? ?Physical Exam ?Constitutional:   ?   Appearance: She is obese.  ?Cardiovascular:  ?   Rate and Rhythm: Normal rate and regular rhythm.  ?Pulmonary:  ?   Effort: Pulmonary effort is normal.  ?   Breath sounds: Normal breath sounds.  ?Musculoskeletal:  ?   Right lower leg: No edema.  ?   Left lower leg: No edema.  ?Skin: ?   General: Skin is warm and dry.  ?   Findings: Rash present.  ?   Comments: Dry hyperpigmented skin patches on the backs of hands and on forearms, developing some excoriations  ?Neurological:  ?   Mental Status: She is alert.  ?Psychiatric:     ?   Mood and Affect: Mood normal.  ?  ? ?Musculoskeletal Exam:  ?Neck full ROM no tenderness ?Bilateral shoulder tenderness no palpable swelling ?Elbows full ROM no tenderness or swelling ?Mild right wrist swelling and tenderness to pressure ?Fingers full ROM no tenderness or swelling ?Bilateral knee tenderness to palpation and with full extension but no appreciable effusions ? ? ?CDAI Exam: ?CDAI Score: 11  ?Patient Global: 40 mm;  Provider Global: 10 mm ?Swollen: 1 ; Tender: 5  ?Joint Exam 05/07/2021  ? ?   Right  Left  ?Glenohumeral   Tender   Tender  ?Wrist  Swollen Tender     ?Knee   Tender   Tender  ? ? ? ?Investigation: ?No additional findings. ? ?Imaging: ?No results found. ? ?Recent Labs: ?Lab Results  ?Component Value Date  ? WBC 6.2 08/26/2020  ? HGB 12.4 08/26/2020  ? PLT 302 08/26/2020  ? NA 141 04/23/2020  ? K 4.6 04/23/2020  ? CL 109 04/23/2020  ? CO2 26 04/23/2020  ? GLUCOSE 90 04/23/2020  ? BUN 15 04/23/2020  ? CREATININE 0.70 04/23/2020  ? BILITOT 0.3 04/23/2020  ? AST 24 04/23/2020  ? ALT 22 04/23/2020  ? PROT 7.1 04/23/2020  ? CALCIUM 8.9 04/23/2020  ? GFRAA 126  04/23/2020  ? ? ?Speciality Comments: No specialty comments available. ? ?Procedures:  ?No procedures performed ?Allergies: Patient has no known allergies.  ? ?Assessment / Plan:     ?Visit Diagnoses: Polyarthritis with positive rheumatoid factor (HCC) - Plan: Anti-Smith antibody, RNP Antibody, Sedimentation rate, hydroxychloroquine (PLAQUENIL) 200 MG tablet ? ?Seems to be redeveloping joint inflammation along with some skin changes and possible Raynaud's.  Rechecking her abnormal labs from 1 year ago with Katrinka BlazingSmith antibody, RNP, rechecking sed rate for inflammatory activity monitoring.  Reviewed medication options and risks again she prefers to try hydroxychloroquine rather than methotrexate due to concern about the side effects.  Is not clear if she had Marissa very good response to treatment last year but reasonable to try this again.  We will restart Plaquenil titrate up to 400 mg daily after Marissa week. ? ?Discoloration of skin of finger ? ?Symptoms still slightly atypical for Raynaud's but do seem to correlate with inflammatory symptoms elsewhere.  No evidence of chronic changes or ischemic injury. ? ?High risk medication use ? ?Reviewed risks of medication she had no particular reaction or intolerance with Plaquenil taken briefly last year.  Reviewed need for ophthalmology  exam follow-up if continuing the medicine. ? ?Orders: ?Orders Placed This Encounter  ?Procedures  ? Anti-Smith antibody  ? RNP Antibody  ? Sedimentation rate  ? ?Meds ordered this encounter  ?Medications  ? hydroxych

## 2021-05-07 ENCOUNTER — Other Ambulatory Visit (HOSPITAL_COMMUNITY): Payer: Self-pay

## 2021-05-07 ENCOUNTER — Encounter: Payer: Self-pay | Admitting: Internal Medicine

## 2021-05-07 ENCOUNTER — Ambulatory Visit: Payer: 59 | Admitting: Internal Medicine

## 2021-05-07 VITALS — BP 144/102 | HR 97 | Ht 62.0 in | Wt 219.2 lb

## 2021-05-07 DIAGNOSIS — L819 Disorder of pigmentation, unspecified: Secondary | ICD-10-CM | POA: Diagnosis not present

## 2021-05-07 DIAGNOSIS — Z79899 Other long term (current) drug therapy: Secondary | ICD-10-CM | POA: Diagnosis not present

## 2021-05-07 DIAGNOSIS — M0580 Other rheumatoid arthritis with rheumatoid factor of unspecified site: Secondary | ICD-10-CM | POA: Diagnosis not present

## 2021-05-07 MED ORDER — HYDROXYCHLOROQUINE SULFATE 200 MG PO TABS
400.0000 mg | ORAL_TABLET | Freq: Every day | ORAL | 3 refills | Status: DC
  Filled 2021-05-07 – 2021-05-29 (×2): qty 60, 30d supply, fill #0

## 2021-05-09 LAB — RNP ANTIBODY: Ribonucleic Protein(ENA) Antibody, IgG: >8 AI — AB

## 2021-05-09 LAB — SEDIMENTATION RATE: Sed Rate: 17 mm/h (ref 0–20)

## 2021-05-09 LAB — ANTI-SMITH ANTIBODY: ENA SM Ab Ser-aCnc: 6.9 AI — AB

## 2021-05-10 NOTE — Progress Notes (Signed)
Lab results are more positive compared to last year. This seems consistent with an ongoing inflammation problem associated with her symptoms, so definitely agree with her trying the medication.

## 2021-05-15 ENCOUNTER — Other Ambulatory Visit (HOSPITAL_COMMUNITY): Payer: Self-pay

## 2021-05-29 ENCOUNTER — Other Ambulatory Visit (HOSPITAL_COMMUNITY): Payer: Self-pay

## 2021-08-06 NOTE — Progress Notes (Deleted)
Office Visit Note  Patient: [name]             Date of Birth: 1980-11-29           MRN: [id number]             PCP: [facility] Referring: [facility]* Visit Date: 08/20/2021   Subjective:  No chief complaint on file.   History of Present Illness: [name] is a 41 y.o. female here for follow up [name] is a 41 y.o. female here for follow up with previous inflammatory joint pain and positive RF.  Previous HPI 05/07/2021  [name] is a 41 y.o. female here for follow up with previous inflammatory joint pain and positive RF, was doing well off treatment.  However since she is back to working more intense work schedule, for example third shift up to 12 or 14 hours at a time symptoms are getting worse again.  She has joint pain and stiffness affecting bilateral shoulders wrists and knees and having bluish discoloration in her fingertips again.  She is seeing some worsening of hyperpigmented skin rashes on her hands and arms as well.  Skin is extremely itchy but she is not sure whether this is just from very frequent handwashing required for her work.   Previous HPI 09/30/20 [name] is a 41 y.o. female here for follow up for seropositive RA after starting methotrexate 15 mg PO weekly about 1 month ago due to lack of improvement with hydroxychloroquine. She is not sure about this medication since she felt joint pain was already improving somewhat on its own, possibly from not working and so using her joints less. She has some skin rash above the left eye and noticed darkening with some peeling on the palms of both hands.   Previous HPI  04/17/20 [name] is a 41 y.o. female here for evaluation of bilateral hand pain, swelling and positive rheumatoid factor Abs. Left hip pain started about 2 months ago with no associated other problems at the time. She took a short duration of steroids with  resolution of symptoms. After finishing that medicine she has noticed hand pain and numbness worst on the left but involving both hands. She does not recall any medical events or changes except for COVID vaccine in the preceding period. She notices fingers turning blue at end of work shift, not particularly correlated with temperature or activity. She tried wearing brace on left wrist and started using right hand more this has helped the left hand partially. Since about 1 month ago noticing dark discoloration around eyes and facial swelling, also linear rashes on the backs of hands.  No Rheumatology ROS completed.   PMFS History:  Patient Active Problem List   Diagnosis Date Noted   High risk medication use 06/27/2020   Morbid obesity with BMI of 40.0-44.9, adult (HCC) 04/17/2020   Anosmia 04/17/2020   Pain of breast 04/17/2020   Vitamin D deficiency 04/17/2020   Bilateral hand pain 04/17/2020   Polyarthritis with positive rheumatoid factor (HCC) 04/17/2020   Facial swelling 04/17/2020   Discoloration of skin of finger 04/17/2020    No past medical history on file.  Family History  Problem Relation Age of Onset   Diabetes Mother    Healthy Daughter    Healthy Daughter    Healthy Son    Past Surgical History:  Procedure Laterality Date   CESAREAN SECTION  2012   CESAREAN SECTION  2007   CESAREAN SECTION  2005   INSERTION OF MESH N/A 09/02/2020   Procedure: INSERTION OF MESH;  Surgeon: Diamantina Monks, MD;  Location: [facility] OR;  Service: General;  Laterality: N/A;   VENTRAL HERNIA REPAIR N/A 09/02/2020   Procedure: LAPAROSCOPIC VENTRAL HERNIA REPAIR;  Surgeon: Diamantina Monks, MD;  Location: [facility] OR;  Service: General;  Laterality: N/A;   Social History   Social History Narrative   Not on file   Immunization History  Administered Date(s) Administered   Influenza Split 01/23/2014, 10/24/2019   PFIZER(Purple Top)SARS-COV-2 Vaccination 08/24/2019, 09/14/2019   Td 01/15/2003   Tdap  07/23/2009     Objective: Vital Signs: There were no vitals taken for this visit.   Physical Exam   Musculoskeletal Exam: ***  CDAI Exam: CDAI Score: -- Patient Global: --; Provider Global: -- Swollen: --; Tender: -- Joint Exam 08/20/2021   No joint exam has been documented for this visit   There is currently no information documented on the homunculus. Go to the Rheumatology activity and complete the homunculus joint exam.  Investigation: No additional findings.  Imaging: No results found.  Recent Labs: Lab Results  Component Value Date   WBC 6.2 08/26/2020   HGB 12.4 08/26/2020   PLT 302 08/26/2020   NA 141 04/23/2020   K 4.6 04/23/2020   CL 109 04/23/2020   CO2 26 04/23/2020   GLUCOSE 90 04/23/2020   BUN 15 04/23/2020   CREATININE 0.70 04/23/2020   BILITOT 0.3 04/23/2020   AST 24 04/23/2020   ALT 22 04/23/2020   PROT 7.1 04/23/2020   CALCIUM 8.9 04/23/2020   GFRAA 126 04/23/2020    Speciality Comments: No specialty comments available.  Procedures:  No procedures performed Allergies: Patient has no known allergies.   Assessment / Plan:     Visit Diagnoses: No diagnosis found.  ***  Orders: No orders of the defined types were placed in this encounter.  No orders of the defined types were placed in this encounter.    Follow-Up Instructions: No follow-ups on file.   Jairo Ben, RT  Note - This record has been created using [employer].  Chart creation errors have been sought, but may not always  have been located. Such creation errors do not reflect on  the standard of medical care.

## 2021-08-20 ENCOUNTER — Ambulatory Visit: Payer: 59 | Admitting: Internal Medicine

## 2021-08-20 DIAGNOSIS — M0580 Other rheumatoid arthritis with rheumatoid factor of unspecified site: Secondary | ICD-10-CM

## 2021-08-20 DIAGNOSIS — L819 Disorder of pigmentation, unspecified: Secondary | ICD-10-CM

## 2021-08-20 DIAGNOSIS — Z79899 Other long term (current) drug therapy: Secondary | ICD-10-CM

## 2021-09-22 NOTE — Progress Notes (Signed)
Office Visit Note  Patient: [name]             Date of Birth: [birth year]           MRN: [id number]             PCP: [facility] [place] Referring: [facility]* Visit Date: 10/01/2021   Subjective:  Follow-up (Discoloration near eyes, dry eyes. )   History of Present Illness: [name] is a 41 y.o. 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 These new skin changes are just within the past few weeks. She stopped taking the hydroxychloroquine after noticing more headaches on the medicine. These improved after stopping it. She has an area of hypopigmentation under the left eye and has noticed dry skin and itching about both eyes. She tried using topical hydrocortisone on these areas but caused stinging discomfort in her eyes. Also has worsening of dark colored rashes with some scaling or flaking on her hands and on her low back. These areas are itchy. She had some swelling and difficulty fully bending her left 5th finger lasting several days recently.  Previous HPI 05/07/2021 [name] is a 41 y.o. female here for follow up with previous inflammatory joint pain and positive RF, was doing well off treatment.  However since she is back to working more intense work schedule, for example third shift up to 12 or 14 hours at a time symptoms are getting worse again.  She has joint pain and stiffness affecting bilateral shoulders wrists and knees and having bluish discoloration in her fingertips again.  She is seeing some worsening of hyperpigmented skin rashes on her hands and arms as well.  Skin is extremely itchy but she is not sure whether this is just from very frequent handwashing required for her work.   Previous HPI 09/30/20 [name] is a 41 y.o. female here for follow up for seropositive RA after  starting methotrexate 15 mg PO weekly about 1 month ago due to lack of improvement with hydroxychloroquine. She is not sure about this medication since she felt joint pain was already improving somewhat on its own, possibly from not working and so using her joints less. She has some skin rash above the left eye and noticed darkening with some peeling on the palms of both hands.   Previous HPI  04/17/20 [name] is a 41 y.o. female here for evaluation of bilateral hand pain, swelling and positive rheumatoid factor Abs. Left hip pain started about 2 months ago with no associated other problems at the time. She took a short duration of steroids with resolution of symptoms. After finishing that medicine she has noticed hand pain and numbness worst on the left but involving both hands. She does not recall any medical events or changes except for COVID vaccine in the preceding period. She notices fingers turning blue at end of work shift, not particularly correlated with temperature or activity. She tried wearing brace on left wrist and started using right hand more this has helped the left hand partially. Since about 1 month ago noticing dark discoloration around eyes and facial swelling, also linear rashes on the backs of hands.    Review of Systems  Constitutional:  Negative for fatigue.  HENT:  Negative for mouth sores and mouth dryness.   Eyes:  Negative  for dryness.  Respiratory:  Negative for shortness of breath.   Cardiovascular:  Negative for chest pain and palpitations.  Gastrointestinal:  Negative for blood in stool, constipation and diarrhea.  Endocrine: Negative for increased urination.  Genitourinary:  Negative for involuntary urination.  Musculoskeletal:  Positive for joint pain, joint pain, joint swelling and morning stiffness. Negative for gait problem, myalgias, muscle weakness, muscle tenderness and myalgias.  Skin:  Positive for color change and rash. Negative for hair loss  and sensitivity to sunlight.  Allergic/Immunologic: Negative for susceptible to infections.  Neurological:  Positive for headaches. Negative for dizziness.  Hematological:  Negative for swollen glands.  Psychiatric/Behavioral:  Negative for depressed mood and sleep disturbance. The patient is not nervous/anxious.     PMFS History:  Patient Active Problem List   Diagnosis Date Noted   High risk medication use 06/27/2020   Morbid obesity with BMI of 40.0-44.9, adult (HCC) 04/17/2020   Anosmia 04/17/2020   Pain of breast 04/17/2020   Vitamin D deficiency 04/17/2020   Bilateral hand pain 04/17/2020   Polyarthritis with positive rheumatoid factor (HCC) 04/17/2020   Facial swelling 04/17/2020   Discoloration of skin of finger 04/17/2020    History reviewed. No pertinent past medical history.  Family History  Problem Relation Age of Onset   Diabetes Mother    Healthy Daughter    Healthy Daughter    Healthy Son    Past Surgical History:  Procedure Laterality Date   CESAREAN SECTION  2012   CESAREAN SECTION  2007   CESAREAN SECTION  2005   INSERTION OF MESH N/A 09/02/2020   Procedure: INSERTION OF MESH;  Surgeon: Diamantina Monks, MD;  Location: [facility] OR;  Service: General;  Laterality: N/A;   VENTRAL HERNIA REPAIR N/A 09/02/2020   Procedure: LAPAROSCOPIC VENTRAL HERNIA REPAIR;  Surgeon: Diamantina Monks, MD;  Location: [facility] OR;  Service: General;  Laterality: N/A;   Social History   Social History Narrative   Not on file   Immunization History  Administered Date(s) Administered   Influenza Split 01/23/2014, 10/24/2019   PFIZER(Purple Top)SARS-COV-2 Vaccination 08/24/2019, 09/14/2019   Td 01/15/2003   Tdap 07/23/2009     Objective: Vital Signs: BP (!) 168/116 (BP Location: Right Arm, Patient Position: Sitting, Cuff Size: Large)   Pulse 71   Resp 15   Ht 5\' 2"  (1.575 m)   Wt 218 lb 6.4 oz (99.1 kg)   LMP 09/27/2021 (Exact Date)   BMI 39.95 kg/m    Physical  Exam Constitutional:      Appearance: She is obese.  HENT:     Right Ear: External ear normal.     Left Ear: External ear normal.  Cardiovascular:     Rate and Rhythm: Normal rate and regular rhythm.  Pulmonary:     Effort: Pulmonary effort is normal.     Breath sounds: Normal breath sounds.  Musculoskeletal:     Right lower leg: No edema.     Left lower leg: No edema.  Lymphadenopathy:     Cervical: Cervical adenopathy (posterior) present.  Skin:    Findings: Rash present.     Comments: Hyperpigmented rashes on dorsum of hands and overlying several finger joints Low back dry, hyperpigmented skin patches with some excoriations  Neurological:     Mental Status: She is alert.  Psychiatric:        Mood and Affect: Mood normal.      Musculoskeletal Exam:  Shoulders full ROM no tenderness or swelling  Elbows full ROM no tenderness or swelling Wrists full ROM no tenderness or swelling Fingers full ROM no tenderness or swelling Knees full ROM no tenderness or swelling Ankles full ROM no tenderness or swelling   Investigation: No additional findings.  Imaging: No results found.  Recent Labs: Lab Results  Component Value Date   WBC 6.2 08/26/2020   HGB 12.4 08/26/2020   PLT 302 08/26/2020   NA 141 04/23/2020   K 4.6 04/23/2020   CL 109 04/23/2020   CO2 26 04/23/2020   GLUCOSE 90 04/23/2020   BUN 15 04/23/2020   CREATININE 0.70 04/23/2020   BILITOT 0.3 04/23/2020   AST 24 04/23/2020   ALT 22 04/23/2020   PROT 7.1 04/23/2020   CALCIUM 8.9 04/23/2020   GFRAA 126 04/23/2020    Speciality Comments: No specialty comments available.  Procedures:  No procedures performed Allergies: Patient has no known allergies.   Assessment / Plan:     Visit Diagnoses: Polyarthritis with positive rheumatoid factor (HCC) - Plan: methotrexate (RHEUMATREX) 2.5 MG tablet, folic acid (FOLVITE) 1 MG tablet  Skin disease is worse and more extensive than before. Joints not too bad and  raynauds doing well. Off HCQ not sure if this was truly causing headaches but she had to stop this medicine twice so will just change plan overall. Plan to restart MTX at 15 mg PO weekly and folic acid 1 mg daily.  High risk medication use - Methotrexate  Plan to resume methotrexate she tolerated this before but reviewed risks again including GI intolerance, pregnancy incompatibility, need for lab monitoring for cytopenias or hepatotoxicity.  Discoloration of skin of finger  Skin rashes now more extensive involving face and low back as well as hands. Not having raynauds problems although may be warmer temperatures. Recommending continuing topical low potency steroid as needed on active rash areas.  Orders: No orders of the defined types were placed in this encounter.  Meds ordered this encounter  Medications   methotrexate (RHEUMATREX) 2.5 MG tablet    Sig: Take 6 tablets (15 mg total) by mouth once a week. Caution:Chemotherapy. Protect from light.    Dispense:  30 tablet    Refill:  1   folic acid (FOLVITE) 1 MG tablet    Sig: Take 1 tablet (1 mg total) by mouth daily.    Dispense:  90 tablet    Refill:  0     Follow-Up Instructions: Return in about 2 months (around 12/01/2021) for RA/SLE MTX restart f/u 46mos.   At least 30 minutes total was spent in this encounter today with medical record review, examination, patient counseling about treatment options, risks, and follow up plan.  Fuller Plan, MD  Note - This record has been created using [employer].  Chart creation errors have been sought, but may not always  have been located. Such creation errors do not reflect on  the standard of medical care.

## 2021-10-01 ENCOUNTER — Other Ambulatory Visit (HOSPITAL_COMMUNITY): Payer: Self-pay

## 2021-10-01 ENCOUNTER — Encounter: Payer: Self-pay | Admitting: Internal Medicine

## 2021-10-01 ENCOUNTER — Ambulatory Visit: Payer: 59 | Attending: Internal Medicine | Admitting: Internal Medicine

## 2021-10-01 VITALS — BP 168/116 | HR 71 | Resp 15 | Ht 62.0 in | Wt 218.4 lb

## 2021-10-01 DIAGNOSIS — M0580 Other rheumatoid arthritis with rheumatoid factor of unspecified site: Secondary | ICD-10-CM

## 2021-10-01 DIAGNOSIS — L819 Disorder of pigmentation, unspecified: Secondary | ICD-10-CM

## 2021-10-01 DIAGNOSIS — Z79899 Other long term (current) drug therapy: Secondary | ICD-10-CM

## 2021-10-01 MED ORDER — FOLIC ACID 1 MG PO TABS
1.0000 mg | ORAL_TABLET | Freq: Every day | ORAL | 0 refills | Status: DC
  Filled 2021-10-01: qty 90, 90d supply, fill #0

## 2021-10-01 MED ORDER — METHOTREXATE 2.5 MG PO TABS
15.0000 mg | ORAL_TABLET | ORAL | 1 refills | Status: DC
  Filled 2021-10-01: qty 24, 28d supply, fill #0

## 2021-10-09 ENCOUNTER — Other Ambulatory Visit (HOSPITAL_COMMUNITY): Payer: Self-pay

## 2021-12-10 ENCOUNTER — Ambulatory Visit: Payer: 59 | Admitting: Internal Medicine

## 2022-01-04 IMAGING — CT CT ABD-PELV W/ CM
1 of 2 series · 14 of 32 positions shown, 19 images · IV contrast (APPLIED)
Comparison: None.

CLINICAL DATA: Anterior abdominal wall palpable mass.

EXAM:
CT ABDOMEN AND PELVIS WITH CONTRAST
TECHNIQUE: Multidetector CT imaging of the abdomen and pelvis was performed
using the standard protocol following bolus administration of
intravenous contrast.
CONTRAST:  80mL [id number] IOPAMIDOL ([id number]) INJECTION 76%

[Series 2: abd/pelvis w/cm · axial · 0.97mm/px · z∈[-419,-14]mm · 14 of 91 slices shown, 19 images]
[im 5/91  soft-tissue]
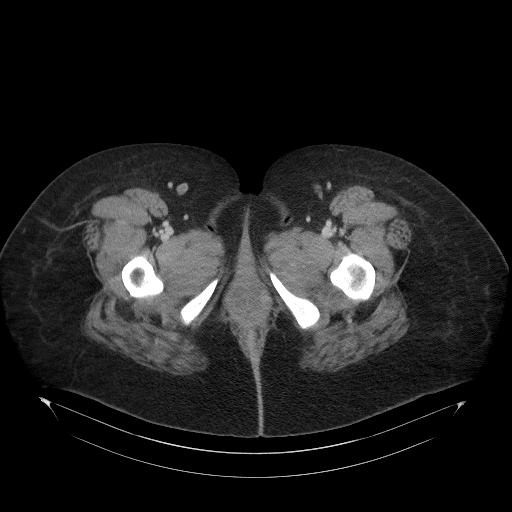
[im 5/91  bone]
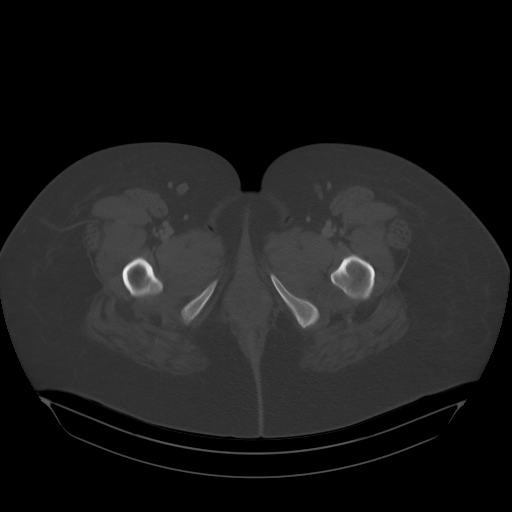
[im 15/91  soft-tissue]
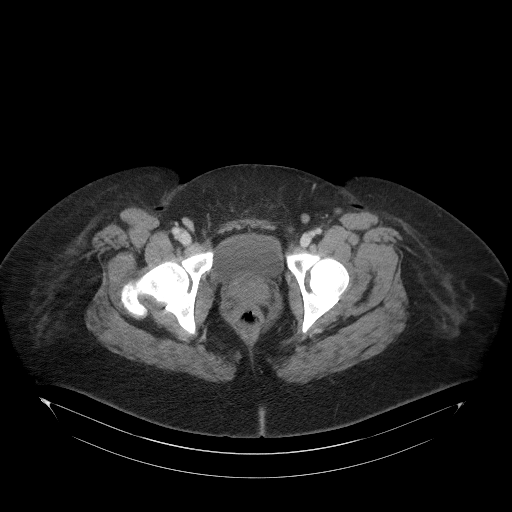
[im 19/91  soft-tissue]
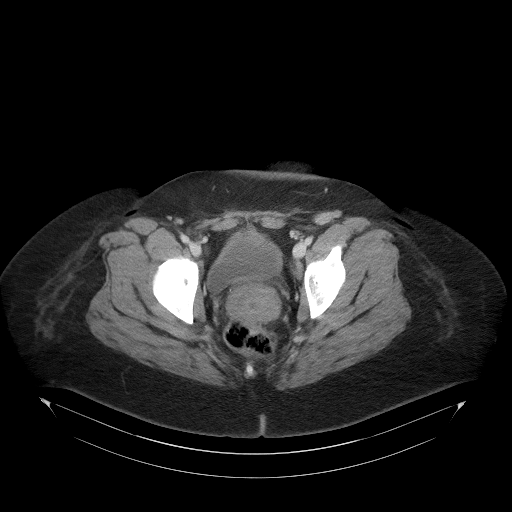
[im 24/91  soft-tissue]
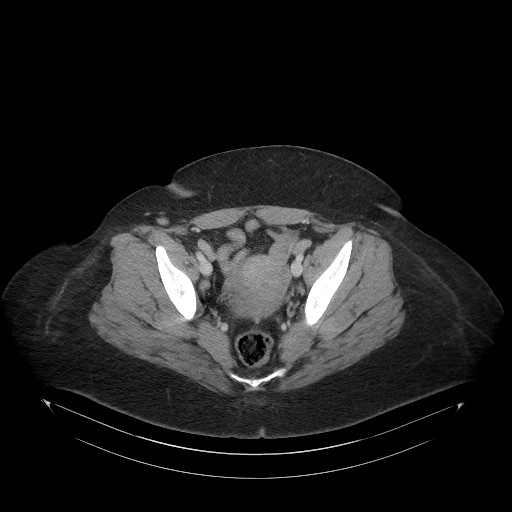
[im 34/91  soft-tissue]
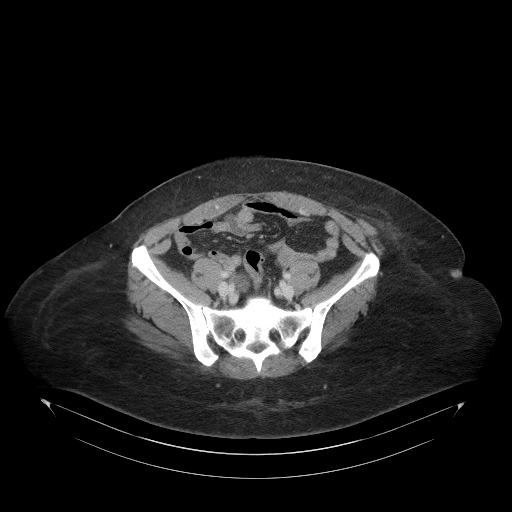
[im 38/91  soft-tissue]
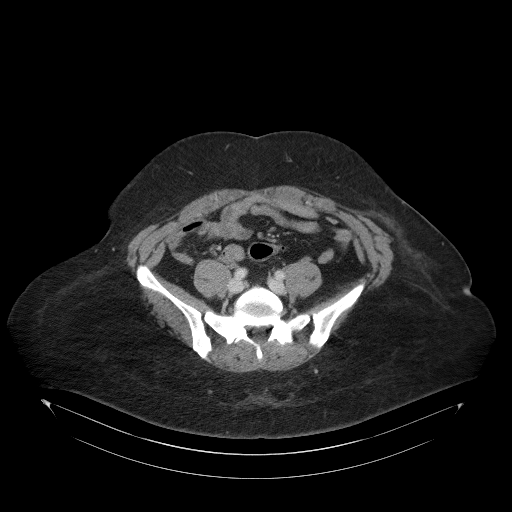
[im 48/91  soft-tissue]
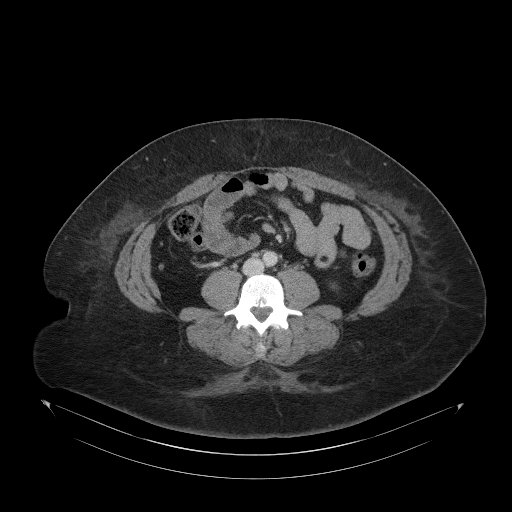
[im 53/91  soft-tissue]
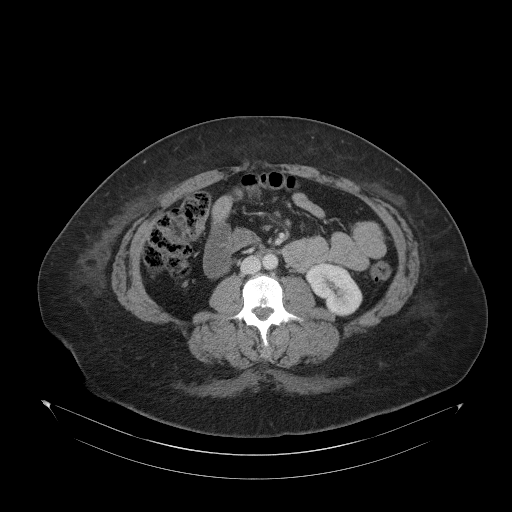
[im 57/91  soft-tissue]
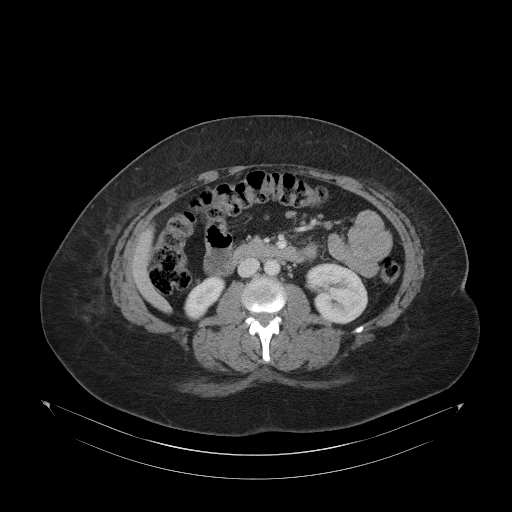
[im 57/91  bone]
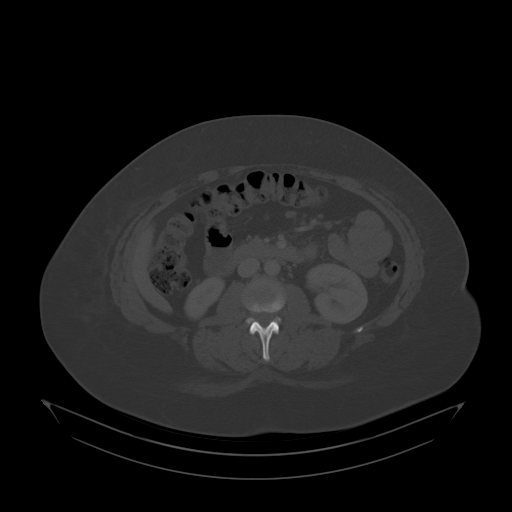
[im 67/91  soft-tissue]
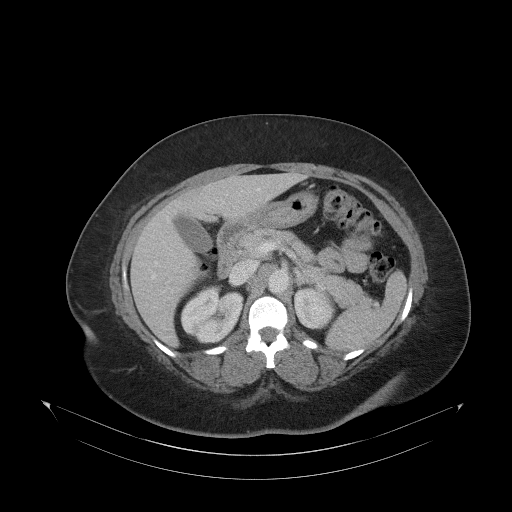
[im 72/91  soft-tissue]
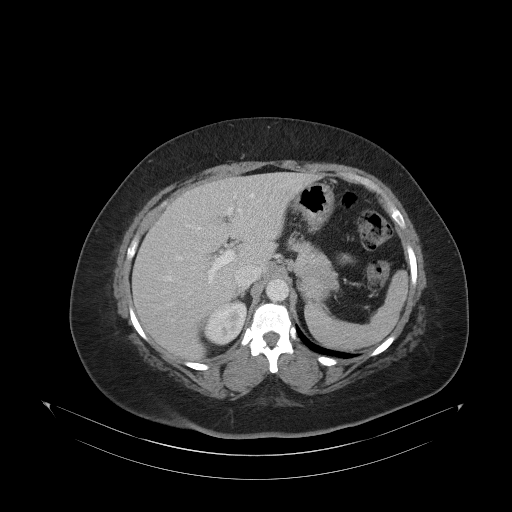
[im 72/91  lung]
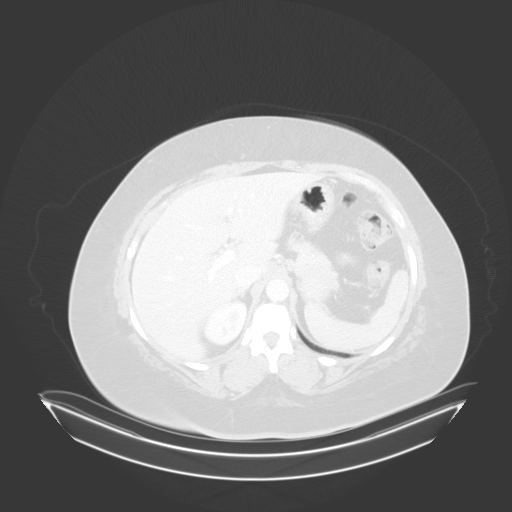
[im 76/91  soft-tissue]
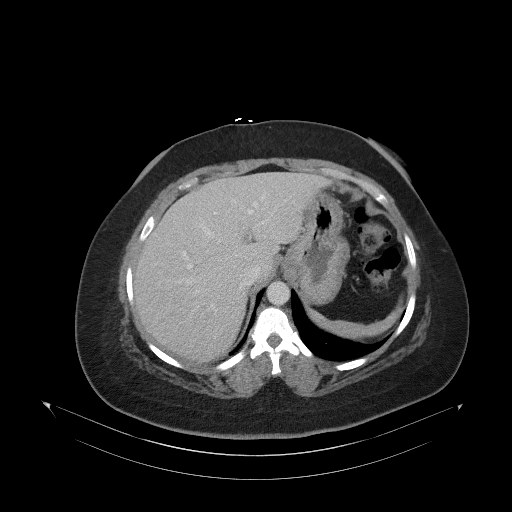
[im 76/91  lung]
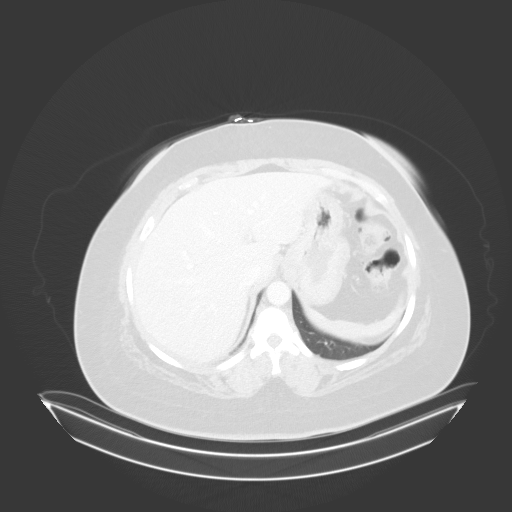
[im 81/91  lung]
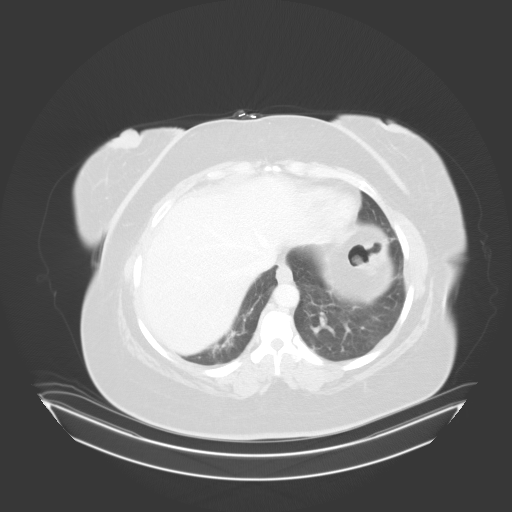
[im 86/91  soft-tissue]
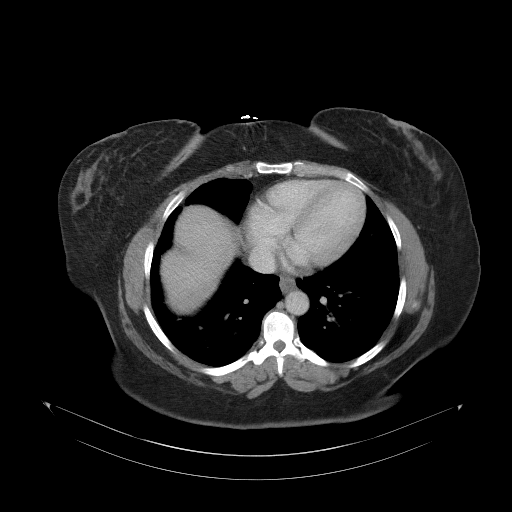
[im 86/91  lung]
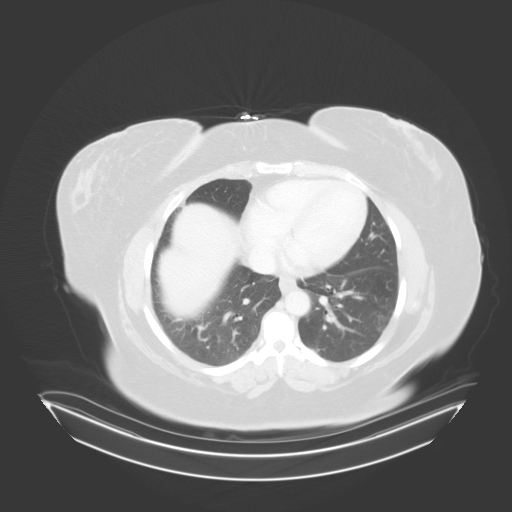

[14 of 32 positions shown; findings below may reference images not displayed]

FINDINGS: Lower Chest: No acute findings.

Hepatobiliary: No hepatic masses identified. Gallbladder is
unremarkable. No evidence of biliary ductal dilatation.

Pancreas:  No mass or inflammatory changes.

Spleen: Within normal limits in size and appearance.

Adrenals/Urinary Tract: No masses identified. No evidence of
ureteral calculi or hydronephrosis.

Stomach/Bowel: No evidence of obstruction, inflammatory process or
abnormal fluid collections. Normal appendix visualized.

Vascular/Lymphatic: No pathologically enlarged lymph nodes. No acute
vascular findings.

Reproductive: No mass or other significant abnormality. Small right
ovarian follicle noted.

Other: A small to moderate paraumbilical ventral hernia is seen
which contains only omental fat.

Musculoskeletal:  No suspicious bone lesions identified.
IMPRESSION: Small to moderate paraumbilical ventral hernia containing only
omental fat.

## 2022-01-06 NOTE — Progress Notes (Signed)
Office Visit Note  Patient: [name]             Date of Birth: 23-Mar-1980           MRN: [id number]             PCP: [place] [place] Referring: [facility]* Visit Date: 01/07/2022   Subjective:   History of Present Illness: NAOMIE CROW is a 42 y.o. 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  She feels joint symptoms are partially improved with decrease stiffness and feels more strength especially in her ability to use her hands.  She does notice some fatigue and general malaise at the time of the week following her methotrexate dose but does not persist all the time.  She is also noticing an unusual taste when taking her folic acid vitamin but not causing any severe GI upset.  As result is only taking this on some days.  Skin rashes have worsened since her last visit.  No additional problems with facial rashes but has dry itchy hyperpigmented patches increasing on dorsal side of her hands and feet bilaterally.  She is getting some excoriations due to the severe itchiness.  She has had some mild episodes of discoloration in her fingertips but no increased trouble since her last visit.  Previous HPI 10/01/21 REED EIFERT is a 42 y.o. 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 These new skin changes are just within the past few weeks. She stopped taking the hydroxychloroquine after noticing more headaches on the medicine. These improved after stopping it. She has an area of hypopigmentation under the left eye and has noticed dry skin and itching about both eyes. She tried using topical hydrocortisone on these areas but caused stinging discomfort in her eyes. Also has worsening of dark colored rashes with some scaling or flaking on her hands and on her low  back. These areas are itchy. She had some swelling and difficulty fully bending her left 5th finger lasting several days recently.   Previous HPI 05/07/2021 DEJANE SCHEIBE is a 42 y.o. female here for follow up with previous inflammatory joint pain and positive RF, was doing well off treatment.  However since she is back to working more intense work schedule, for example third shift up to 12 or 14 hours at a time symptoms are getting worse again.  She has joint pain and stiffness affecting bilateral shoulders wrists and knees and having bluish discoloration in her fingertips again.  She is seeing some worsening of hyperpigmented skin rashes on her hands and arms as well.  Skin is extremely itchy but she is not sure whether this is just from very frequent handwashing required for her work.   Previous HPI 09/30/20 RYN PEINE is a 43 y.o. female here for follow up for seropositive RA after starting methotrexate 15 mg PO weekly about 1 month ago due to lack of improvement with hydroxychloroquine. She is not sure about this medication since she felt joint pain was already improving somewhat on its own, possibly from not working and so using her joints less. She has some skin rash above the left eye and noticed darkening with some peeling on the palms of both hands.   Previous HPI  04/17/20 Kayley  L Ernandez is a 42 y.o. female here for evaluation of bilateral hand pain, swelling and positive rheumatoid factor Abs. Left hip pain started about 2 months ago with no associated other problems at the time. She took a short duration of steroids with resolution of symptoms. After finishing that medicine she has noticed hand pain and numbness worst on the left but involving both hands. She does not recall any medical events or changes except for COVID vaccine in the preceding period. She notices fingers turning blue at end of work shift, not particularly correlated with temperature or activity. She tried  wearing brace on left wrist and started using right hand more this has helped the left hand partially. Since about 1 month ago noticing dark discoloration around eyes and facial swelling, also linear rashes on the backs of hands.   Review of Systems  Constitutional:  Positive for fatigue.  HENT:  Negative for mouth sores and mouth dryness.   Eyes:  Negative for dryness.  Respiratory:  Negative for shortness of breath.   Cardiovascular:  Negative for chest pain and palpitations.  Gastrointestinal:  Negative for blood in stool, constipation and diarrhea.  Endocrine: Negative for increased urination.  Genitourinary:  Negative for involuntary urination.  Musculoskeletal:  Positive for muscle weakness and morning stiffness. Negative for joint pain, gait problem, joint pain, joint swelling, myalgias, muscle tenderness and myalgias.  Skin:  Positive for color change, rash and sensitivity to sunlight. Negative for hair loss.  Allergic/Immunologic: Negative for susceptible to infections.  Neurological:  Positive for headaches. Negative for dizziness.  Hematological:  Negative for swollen glands.  Psychiatric/Behavioral:  Negative for depressed mood and sleep disturbance. The patient is not nervous/anxious.     PMFS History:  Patient Active Problem List   Diagnosis Date Noted   High risk medication use 06/27/2020   Morbid obesity with BMI of 40.0-44.9, adult (HCC) 04/17/2020   Anosmia 04/17/2020   Pain of breast 04/17/2020   Vitamin D deficiency 04/17/2020   Bilateral hand pain 04/17/2020   Seropositive rheumatoid arthritis (HCC) 04/17/2020   Facial swelling 04/17/2020   Discoloration of skin of finger 04/17/2020    History reviewed. No pertinent past medical history.  Family History  Problem Relation Age of Onset   Diabetes Mother    Healthy Daughter    Healthy Daughter    Healthy Son    Past Surgical History:  Procedure Laterality Date   CESAREAN SECTION  2012   CESAREAN SECTION   2007   CESAREAN SECTION  2005   INSERTION OF MESH N/A 09/02/2020   Procedure: INSERTION OF MESH;  Surgeon: Diamantina Monks, MD;  Location: [facility];  Service: General;  Laterality: N/A;   VENTRAL HERNIA REPAIR N/A 09/02/2020   Procedure: LAPAROSCOPIC VENTRAL HERNIA REPAIR;  Surgeon: Diamantina Monks, MD;  Location: [facility];  Service: General;  Laterality: N/A;   Social History   Social History Narrative   Not on file   Immunization History  Administered Date(s) Administered   Influenza Split 01/23/2014, 10/24/2019   PFIZER(Purple Top)SARS-COV-2 Vaccination 08/24/2019, 09/14/2019   Td 01/15/2003   Tdap 07/23/2009     Objective: Vital Signs: BP (!) 158/100 (BP Location: Right Arm, Patient Position: Sitting, Cuff Size: Normal)   Pulse 96   Resp 15   Ht 5\' 1"  (1.549 m)   Wt 228 lb (103.4 kg)   BMI 43.08 kg/m    Physical Exam Constitutional:      Appearance: She is obese.  Eyes:  Conjunctiva/sclera: Conjunctivae normal.  Cardiovascular:     Rate and Rhythm: Normal rate and regular rhythm.  Pulmonary:     Effort: Pulmonary effort is normal.     Breath sounds: Normal breath sounds.  Lymphadenopathy:     Cervical: No cervical adenopathy.  Skin:    Findings: Rash present.     Comments: Dry flat hyperpigmented patches on the dorsal side of both hands and feet, most prominent around the base of the fourth and fifth fingers, some overlying excoriations with a few unroofed papules on the forearm and lower leg above the ankle No digital pitting ulcers or skin peeling  Neurological:     Mental Status: She is alert.  Psychiatric:        Mood and Affect: Mood normal.      Musculoskeletal Exam:  Shoulders full ROM no tenderness or swelling Elbows full ROM no tenderness or swelling Wrists full ROM no tenderness or swelling Fingers full ROM no tenderness or swelling Knees full ROM no tenderness or swelling Ankles full ROM no tenderness or swelling   CDAI Exam: CDAI Score: 2   Patient Global: 10 mm; Provider Global: 10 mm Swollen: 0 ; Tender: 0  Joint Exam 01/07/2022   All documented joints were normal     Investigation: No additional findings.  Imaging: No results found.  Recent Labs: Lab Results  Component Value Date   WBC 4.9 01/07/2022   HGB 11.3 (L) 01/07/2022   PLT 321 01/07/2022   NA 142 01/07/2022   K 4.1 01/07/2022   CL 108 01/07/2022   CO2 26 01/07/2022   GLUCOSE 94 01/07/2022   BUN 14 01/07/2022   CREATININE 0.65 01/07/2022   BILITOT 0.3 01/07/2022   AST 14 01/07/2022   ALT 14 01/07/2022   PROT 6.8 01/07/2022   CALCIUM 8.9 01/07/2022   GFRAA 126 04/23/2020    Speciality Comments: No specialty comments available.  Procedures:  No procedures performed Allergies: Patient has no known allergies.   Assessment / Plan:     Visit Diagnoses: Seropositive rheumatoid arthritis (HCC) - Plan: folic acid (FOLVITE) 1 MG tablet, methotrexate (RHEUMATREX) 2.5 MG tablet, Sedimentation rate, C-reactive protein  Some definite improvement in peripheral joint swelling and stiffness with the methotrexate treatment.  Will recheck sedimentation rate and CRP for disease activity assessment.  Plan to continue methotrexate 15 mg p.o. weekly and folic acid 1 mg daily.  High risk medication use - Plan: CBC with Differential/Platelet, COMPLETE METABOLIC PANEL WITH GFR  Checking CBC and CMP for methotrexate toxicity monitoring.  She has not experienced any significant infections since her last visit.  Rash and other nonspecific skin eruption Discoloration of skin of finger - Plan: triamcinolone ointment (KENALOG) 0.5 %  Not complaining of increased Raynaud's symptoms but has hyperpigmentation with some dryness and skin thickening and linear excoriations.  Not sure if this is a part of her inflammatory disease that is not responding well to the methotrexate so far or separate issue possible dry skin or eczema.  Recommended being diligent with moisturizing  and importance of avoiding scratching which will lead to more permanent or long-term skin change also prescribed Kenalog 0.5% to apply on the affected areas up to twice daily.  Orders: Orders Placed This Encounter  Procedures   Sedimentation rate   C-reactive protein   CBC with Differential/Platelet   COMPLETE METABOLIC PANEL WITH GFR   Meds ordered this encounter  Medications   folic acid (FOLVITE) 1 MG tablet    Sig:  Take 1 tablet (1 mg total) by mouth daily.    Dispense:  90 tablet    Refill:  3   methotrexate (RHEUMATREX) 2.5 MG tablet    Sig: Take 6 tablets (15 mg total) by mouth once a week. Caution:Chemotherapy. Protect from light.    Dispense:  78 tablet    Refill:  0   triamcinolone ointment (KENALOG) 0.5 %    Sig: Apply 1 Application topically 2 (two) times daily.    Dispense:  30 g    Refill:  2     Follow-Up Instructions: Return in about 3 months (around 04/08/2022) for RA/SLE on MTX f/u 46mos.   Collier Salina, MD  Note - This record has been created using [employer].  Chart creation errors have been sought, but may not always  have been located. Such creation errors do not reflect on  the standard of medical care.

## 2022-01-07 ENCOUNTER — Other Ambulatory Visit (HOSPITAL_COMMUNITY): Payer: Self-pay

## 2022-01-07 ENCOUNTER — Ambulatory Visit: Payer: Commercial Managed Care - PPO | Attending: Internal Medicine | Admitting: Internal Medicine

## 2022-01-07 ENCOUNTER — Encounter: Payer: Self-pay | Admitting: Internal Medicine

## 2022-01-07 VITALS — BP 158/100 | HR 96 | Resp 15 | Ht 61.0 in | Wt 228.0 lb

## 2022-01-07 DIAGNOSIS — M059 Rheumatoid arthritis with rheumatoid factor, unspecified: Secondary | ICD-10-CM

## 2022-01-07 DIAGNOSIS — Z79899 Other long term (current) drug therapy: Secondary | ICD-10-CM | POA: Diagnosis not present

## 2022-01-07 DIAGNOSIS — L819 Disorder of pigmentation, unspecified: Secondary | ICD-10-CM

## 2022-01-07 DIAGNOSIS — R21 Rash and other nonspecific skin eruption: Secondary | ICD-10-CM

## 2022-01-07 MED ORDER — TRIAMCINOLONE ACETONIDE 0.5 % EX OINT
1.0000 | TOPICAL_OINTMENT | Freq: Two times a day (BID) | CUTANEOUS | 2 refills | Status: AC
  Filled 2022-01-07: qty 30, 15d supply, fill #0

## 2022-01-07 MED ORDER — FOLIC ACID 1 MG PO TABS
1.0000 mg | ORAL_TABLET | Freq: Every day | ORAL | 3 refills | Status: AC
  Filled 2022-01-07: qty 90, 90d supply, fill #0

## 2022-01-07 MED ORDER — METHOTREXATE SODIUM 2.5 MG PO TABS
15.0000 mg | ORAL_TABLET | ORAL | 0 refills | Status: AC
  Filled 2022-01-07: qty 72, 90d supply, fill #0

## 2022-01-08 LAB — COMPLETE METABOLIC PANEL WITH GFR
AG Ratio: 1.2 (calc) (ref 1.0–2.5)
ALT: 14 U/L (ref 6–29)
AST: 14 U/L (ref 10–30)
Albumin: 3.7 g/dL (ref 3.6–5.1)
Alkaline phosphatase (APISO): 88 U/L (ref 31–125)
BUN: 14 mg/dL (ref 7–25)
CO2: 26 mmol/L (ref 20–32)
Calcium: 8.9 mg/dL (ref 8.6–10.2)
Chloride: 108 mmol/L (ref 98–110)
Creat: 0.65 mg/dL (ref 0.50–0.99)
Globulin: 3.1 g/dL (calc) (ref 1.9–3.7)
Glucose, Bld: 94 mg/dL (ref 65–99)
Potassium: 4.1 mmol/L (ref 3.5–5.3)
Sodium: 142 mmol/L (ref 135–146)
Total Bilirubin: 0.3 mg/dL (ref 0.2–1.2)
Total Protein: 6.8 g/dL (ref 6.1–8.1)
eGFR: 114 mL/min/{1.73_m2} (ref >=60)

## 2022-01-08 LAB — CBC WITH DIFFERENTIAL/PLATELET
Absolute Monocytes: 441 cells/uL (ref 200–950)
Basophils Absolute: 20 cells/uL (ref 0–200)
Basophils Relative: 0.4 %
Eosinophils Absolute: 132 cells/uL (ref 15–500)
Eosinophils Relative: 2.7 %
HCT: 35.6 % (ref 35.0–45.0)
Hemoglobin: 11.3 g/dL — ABNORMAL LOW (ref 11.7–15.5)
Lymphs Abs: 1548 cells/uL (ref 850–3900)
MCH: 25.1 pg — ABNORMAL LOW (ref 27.0–33.0)
MCHC: 31.7 g/dL — ABNORMAL LOW (ref 32.0–36.0)
MCV: 79.1 fL — ABNORMAL LOW (ref 80.0–100.0)
MPV: 9.6 fL (ref 7.5–12.5)
Monocytes Relative: 9 %
Neutro Abs: 2759 cells/uL (ref 1500–7800)
Neutrophils Relative %: 56.3 %
Platelets: 321 10*3/uL (ref 140–400)
RBC: 4.5 10*6/uL (ref 3.80–5.10)
RDW: 18 % — ABNORMAL HIGH (ref 11.0–15.0)
Total Lymphocyte: 31.6 %
WBC: 4.9 10*3/uL (ref 3.8–10.8)

## 2022-01-08 LAB — C-REACTIVE PROTEIN: CRP: 18.6 mg/L — ABNORMAL HIGH (ref <8.0)

## 2022-01-08 LAB — SEDIMENTATION RATE: Sed Rate: 11 mm/h (ref 0–20)

## 2022-01-12 ENCOUNTER — Other Ambulatory Visit (HOSPITAL_COMMUNITY): Payer: Self-pay

## 2022-01-25 NOTE — Progress Notes (Signed)
Lab results look fine for continuing methotrexate. Her CRP result is 18.6 this probably indicates some inflammation but I do not have any other for comparison. I recommend continuing treatment as planned.

## 2022-03-10 ENCOUNTER — Other Ambulatory Visit (HOSPITAL_COMMUNITY): Payer: Self-pay

## 2022-03-10 ENCOUNTER — Ambulatory Visit
Admission: EM | Admit: 2022-03-10 | Discharge: 2022-03-10 | Disposition: A | Discharge status: Home / Self Care | Payer: Commercial Managed Care - PPO | Attending: Urgent Care | Admitting: Urgent Care

## 2022-03-10 DIAGNOSIS — R350 Frequency of micturition: Secondary | ICD-10-CM | POA: Diagnosis not present

## 2022-03-10 DIAGNOSIS — N3001 Acute cystitis with hematuria: Secondary | ICD-10-CM | POA: Insufficient documentation

## 2022-03-10 LAB — POCT URINALYSIS DIP (MANUAL ENTRY)
Bilirubin, UA: NEGATIVE
Glucose, UA: NEGATIVE mg/dL
Ketones, POC UA: NEGATIVE mg/dL
Nitrite, UA: NEGATIVE
Protein Ur, POC: 30 mg/dL — AB
Spec Grav, UA: 1.025 (ref 1.010–1.025)
Urobilinogen, UA: 1 E.U./dL
pH, UA: 7 (ref 5.0–8.0)

## 2022-03-10 LAB — POCT URINE PREGNANCY: Preg Test, Ur: NEGATIVE

## 2022-03-10 MED ORDER — CEPHALEXIN 500 MG PO CAPS
500.0000 mg | ORAL_CAPSULE | Freq: Two times a day (BID) | ORAL | 0 refills | Status: AC
  Filled 2022-03-10: qty 10, 5d supply, fill #0

## 2022-03-10 NOTE — ED Triage Notes (Signed)
Pt c/o urgency, frequency starting yesterday, burning with urination started yesterday evening. Denies N, V, F or abd pain.

## 2022-03-10 NOTE — ED Provider Notes (Signed)
[facility]  Note:  This document was prepared using [employer] and may include unintentional dictation errors.  MRN: [id number] DOB: 08-11-1980  Subjective:   [name] is a 42 y.o. female presenting for 1 day history of acute onset dysuria, urinary frequency, urinary urgency. Denies fever, n/v, abdominal pain, pelvic pain, rashes, hematuria, vaginal discharge.  Does not have concern for STI.  She is on methotrexate and therefore immunocompromise.  She is trying to hydrate better.  Has been drinking a lot of sodas.  No current facility-administered medications for this encounter.  Current Outpatient Medications:    aspirin-acetaminophen-caffeine (EXCEDRIN EXTRA STRENGTH) 250-250-65 MG tablet, Take 1 tablet by mouth every 6 (six) hours as needed for migraine., Disp: , Rfl:    Biotin w/ Vitamins C & E (HAIR/SKIN/NAILS PO), Take 1 tablet by mouth daily., Disp: , Rfl:    ergocalciferol (VITAMIN D2) 1.25 MG (50000 UT) capsule, Take 1 capsule (50,000 Units total) by mouth 2 (two) times a week. (Patient not taking: Reported on 10/01/2021), Disp: 88 capsule, Rfl: 0   folic acid (FOLVITE) 1 MG tablet, Take 1 tablet (1 mg total) by mouth daily., Disp: 90 tablet, Rfl: 3   hydrocortisone cream 1 %, Apply 1 application topically daily as needed for itching., Disp: , Rfl:    methotrexate (RHEUMATREX) 2.5 MG tablet, Take 6 tablets (15 mg total) by mouth once a week. Caution:Chemotherapy. Protect from light., Disp: 78 tablet, Rfl: 0   triamcinolone ointment (KENALOG) 0.5 %, Apply 1 Application topically 2 (two) times daily., Disp: 30 g, Rfl: 2   No Known Allergies  History reviewed. No pertinent past medical history.   Past Surgical History:  Procedure Laterality Date   CESAREAN SECTION  2012   CESAREAN SECTION  2007   CESAREAN SECTION  2005   INSERTION OF MESH N/A 09/02/2020   Procedure: INSERTION OF MESH;  Surgeon: Jesusita Oka, MD;   Location: [facility];  Service: General;  Laterality: N/A;   VENTRAL HERNIA REPAIR N/A 09/02/2020   Procedure: LAPAROSCOPIC VENTRAL HERNIA REPAIR;  Surgeon: Jesusita Oka, MD;  Location: [facility];  Service: General;  Laterality: N/A;    Family History  Problem Relation Age of Onset   Diabetes Mother    Healthy Daughter    Healthy Daughter    Healthy Son     Social History   Tobacco Use   Smoking status: Never    Passive exposure: Never   Smokeless tobacco: Never  Vaping Use   Vaping Use: Never used  Substance Use Topics   Alcohol use: Yes    Comment: Occasionally   Drug use: Never    ROS   Objective:   Vitals: BP (!) 159/106 (BP Location: Left Arm)   Pulse 92   Temp 98.3 F (36.8 C) (Oral)   Resp 18   LMP 03/09/2022 (Exact Date)   SpO2 98%   Physical Exam Constitutional:      General: She is not in acute distress.    Appearance: Normal appearance. She is well-developed. She is not ill-appearing, toxic-appearing or diaphoretic.  HENT:     Head: Normocephalic and atraumatic.     Nose: Nose normal.     Mouth/Throat:     Mouth: Mucous membranes are moist.  Eyes:     General: No scleral icterus.       Right eye: No discharge.        Left eye: No discharge.  Extraocular Movements: Extraocular movements intact.  Cardiovascular:     Rate and Rhythm: Normal rate.  Pulmonary:     Effort: Pulmonary effort is normal.  Skin:    General: Skin is warm and dry.  Neurological:     General: No focal deficit present.     Mental Status: She is alert and oriented to person, place, and time.  Psychiatric:        Mood and Affect: Mood normal.        Behavior: Behavior normal.     Results for orders placed or performed during the hospital encounter of 03/10/22 (from the past 24 hour(s))  POCT urine pregnancy     Status: None   Collection Time: 03/10/22 11:44 AM  Result Value Ref Range   Preg Test, Ur Negative Negative  POCT urinalysis dipstick     Status: Abnormal    Collection Time: 03/10/22 11:45 AM  Result Value Ref Range   Color, UA yellow yellow   Clarity, UA clear clear   Glucose, UA negative negative mg/dL   Bilirubin, UA negative negative   Ketones, POC UA negative negative mg/dL   Spec Grav, UA 1.025 1.010 - 1.025   Blood, UA large (A) negative   pH, UA 7.0 5.0 - 8.0   Protein Ur, POC =30 (A) negative mg/dL   Urobilinogen, UA 1.0 0.2 or 1.0 E.U./dL   Nitrite, UA Negative Negative   Leukocytes, UA Trace (A) Negative    Assessment and Plan :   PDMP not reviewed this encounter.  1. Acute cystitis with hematuria   2. Urinary frequency     Start Keflex to cover for acute cystitis, urine culture pending.  This is especially important as she is immunocompromised due to the methotrexate use.  Recommended aggressive hydration, limiting urinary irritants. Counseled patient on potential for adverse effects with medications prescribed/recommended today, ER and return-to-clinic precautions discussed, patient verbalized understanding.    Jaynee Eagles, [place] 03/10/22 1223

## 2022-03-10 NOTE — Discharge Instructions (Signed)
Please start Keflex to address an urinary tract infection. Make sure you hydrate very well with plain water and a quantity of 64 ounces of water a day.  Please limit drinks that are considered urinary irritants such as soda, sweet tea, coffee, energy drinks, alcohol.  These can worsen your urinary and genital symptoms but also be the source of them.  I will let you know about your urine culture results through MyChart to see if we need to prescribe or change your antibiotics based off of those results.

## 2022-03-12 LAB — URINE CULTURE: Culture: 20000 — AB

## 2022-04-01 DIAGNOSIS — Z1231 Encounter for screening mammogram for malignant neoplasm of breast: Secondary | ICD-10-CM | POA: Diagnosis not present

## 2022-04-01 DIAGNOSIS — Z124 Encounter for screening for malignant neoplasm of cervix: Secondary | ICD-10-CM | POA: Diagnosis not present

## 2022-04-01 DIAGNOSIS — Z13220 Encounter for screening for lipoid disorders: Secondary | ICD-10-CM | POA: Diagnosis not present

## 2022-04-01 DIAGNOSIS — Z0000 Encounter for general adult medical examination without abnormal findings: Secondary | ICD-10-CM | POA: Diagnosis not present

## 2022-04-01 DIAGNOSIS — M059 Rheumatoid arthritis with rheumatoid factor, unspecified: Secondary | ICD-10-CM | POA: Diagnosis not present

## 2022-04-01 DIAGNOSIS — Z6841 Body Mass Index (BMI) 40.0 and over, adult: Secondary | ICD-10-CM | POA: Diagnosis not present

## 2022-04-01 DIAGNOSIS — Z9229 Personal history of other drug therapy: Secondary | ICD-10-CM | POA: Diagnosis not present

## 2022-04-08 ENCOUNTER — Ambulatory Visit: Payer: Commercial Managed Care - PPO | Admitting: Internal Medicine

## 2022-04-29 ENCOUNTER — Ambulatory Visit: Payer: Commercial Managed Care - PPO | Admitting: Internal Medicine

## 2022-06-02 DIAGNOSIS — N390 Urinary tract infection, site not specified: Secondary | ICD-10-CM | POA: Diagnosis not present

## 2023-04-08 DIAGNOSIS — M059 Rheumatoid arthritis with rheumatoid factor, unspecified: Secondary | ICD-10-CM | POA: Diagnosis not present

## 2023-04-08 DIAGNOSIS — M329 Systemic lupus erythematosus, unspecified: Secondary | ICD-10-CM | POA: Diagnosis not present

## 2023-04-08 DIAGNOSIS — Z13220 Encounter for screening for lipoid disorders: Secondary | ICD-10-CM | POA: Diagnosis not present

## 2023-04-08 DIAGNOSIS — Z1231 Encounter for screening mammogram for malignant neoplasm of breast: Secondary | ICD-10-CM | POA: Diagnosis not present

## 2023-04-08 DIAGNOSIS — Z9229 Personal history of other drug therapy: Secondary | ICD-10-CM | POA: Diagnosis not present

## 2023-04-08 DIAGNOSIS — Z124 Encounter for screening for malignant neoplasm of cervix: Secondary | ICD-10-CM | POA: Diagnosis not present

## 2023-04-08 DIAGNOSIS — Z0000 Encounter for general adult medical examination without abnormal findings: Secondary | ICD-10-CM | POA: Diagnosis not present

## 2023-04-18 ENCOUNTER — Other Ambulatory Visit: Payer: Self-pay | Admitting: Family Medicine

## 2023-04-18 DIAGNOSIS — Z1231 Encounter for screening mammogram for malignant neoplasm of breast: Secondary | ICD-10-CM

## 2023-05-27 DIAGNOSIS — Z1231 Encounter for screening mammogram for malignant neoplasm of breast: Secondary | ICD-10-CM | POA: Diagnosis not present

## 2023-08-16 DIAGNOSIS — M069 Rheumatoid arthritis, unspecified: Secondary | ICD-10-CM | POA: Diagnosis not present

## 2023-08-16 DIAGNOSIS — R3915 Urgency of urination: Secondary | ICD-10-CM | POA: Diagnosis not present

## 2024-02-07 ENCOUNTER — Other Ambulatory Visit (HOSPITAL_COMMUNITY): Payer: Self-pay

## 2024-02-07 ENCOUNTER — Encounter: Payer: Self-pay | Admitting: Internal Medicine

## 2024-02-07 ENCOUNTER — Ambulatory Visit: Admitting: Internal Medicine

## 2024-02-07 VITALS — BP 138/99 | HR 93 | Temp 98.0°F | Resp 16 | Ht 61.0 in | Wt 233.5 lb

## 2024-02-07 DIAGNOSIS — R899 Unspecified abnormal finding in specimens from other organs, systems and tissues: Secondary | ICD-10-CM | POA: Insufficient documentation

## 2024-02-07 DIAGNOSIS — M545 Low back pain, unspecified: Secondary | ICD-10-CM | POA: Insufficient documentation

## 2024-02-07 DIAGNOSIS — M059 Rheumatoid arthritis with rheumatoid factor, unspecified: Secondary | ICD-10-CM

## 2024-02-07 MED ORDER — CYCLOBENZAPRINE HCL 10 MG PO TABS
10.0000 mg | ORAL_TABLET | Freq: Two times a day (BID) | ORAL | 0 refills | Status: AC | PRN
  Filled 2024-02-07: qty 20, 10d supply, fill #0

## 2024-02-07 NOTE — Progress Notes (Unsigned)
**Note De-Identified Marissa Obfuscation**  "  Office Visit Note  Patient: [name]             Date of Birth: [birth year]           MRN: [id number]             PCP: [facility] Referring: [facility] Visit Date: 02/07/2024   Subjective:  Pain (Back )   History of Present Illness: [name] is a 44 y.o. female here for follow up ***   Previous HPI 01/07/22 [name] is a 44 y.o. 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  She feels joint symptoms are partially improved with decrease stiffness and feels more strength especially in her ability to use her hands.  She does notice some fatigue and general malaise at the time of the week following her methotrexate  dose but does not persist all the time.  She is also noticing an unusual taste when taking her folic acid  vitamin but not causing any severe GI upset.  As result is only taking this on some days.  Skin rashes have worsened since her last visit.  No additional problems with facial rashes but has dry itchy hyperpigmented patches increasing on dorsal side of her hands and feet bilaterally.  She is getting some excoriations due to the severe itchiness.  She has had some mild episodes of discoloration in her fingertips but no increased trouble since her last visit.   Previous HPI 10/01/21 [name] is a 44 y.o. 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 These new skin changes are just within the past few weeks. She stopped taking the hydroxychloroquine  after noticing more headaches on the medicine. These improved after stopping it. She has an area of hypopigmentation under the left eye and has noticed dry skin and itching about both eyes. She tried using topical hydrocortisone on these areas but caused stinging discomfort  in her eyes. Also has worsening of dark colored rashes with some scaling or flaking on her hands and on her low back. These areas are itchy. She had some swelling and difficulty fully bending her left 5th finger lasting several days recently.   Previous HPI 05/07/2021 [name] is a 44 y.o. female here for follow up with previous inflammatory joint pain and positive RF, was doing well off treatment.  However since she is back to working more intense work schedule, for example third shift up to 12 or 14 hours at a time symptoms are getting worse again.  She has joint pain and stiffness affecting bilateral shoulders wrists and knees and having bluish discoloration in her fingertips again.  She is seeing some worsening of hyperpigmented skin rashes on her hands and arms as well.  Skin is extremely itchy but she is not sure whether this is just from very frequent handwashing required for her work.   Previous HPI 09/30/20 [name] is a 44 y.o. female here for follow up for seropositive RA after starting methotrexate  15 mg PO weekly about 1 month ago due to lack of improvement with hydroxychloroquine . She is not sure about this medication since she felt joint pain was already improving somewhat on its own, possibly from not working and so using her joints less. She has  some skin rash above the left eye and noticed darkening with some peeling on the palms of both hands.   Previous HPI  04/17/20 [name] is a 44 y.o. female here for evaluation of bilateral hand pain, swelling and positive rheumatoid factor Abs. Left hip pain started about 2 months ago with no associated other problems at the time. She took a short duration of steroids with resolution of symptoms. After finishing that medicine she has noticed hand pain and numbness worst on the left but involving both hands. She does not recall any medical events or changes except for COVID vaccine in the preceding period. She notices  fingers turning blue at end of work shift, not particularly correlated with temperature or activity. She tried wearing brace on left wrist and started using right hand more this has helped the left hand partially. Since about 1 month ago noticing dark discoloration around eyes and facial swelling, also linear rashes on the backs of hands.   Review of Systems  Constitutional:  Negative for fatigue.  HENT:  Negative for mouth sores and mouth dryness.   Eyes:  Negative for dryness.  Respiratory:  Negative for shortness of breath.   Cardiovascular:  Negative for chest pain and palpitations.  Gastrointestinal:  Negative for blood in stool, constipation and diarrhea.  Endocrine: Negative for increased urination.  Genitourinary:  Negative for involuntary urination.  Musculoskeletal:  Positive for joint pain, joint pain, muscle weakness, morning stiffness and muscle tenderness. Negative for gait problem, joint swelling, myalgias and myalgias.  Skin:  Positive for color change and hair loss. Negative for rash and sensitivity to sunlight.  Allergic/Immunologic: Negative for susceptible to infections.  Neurological:  Positive for headaches. Negative for dizziness.  Hematological:  Negative for swollen glands.  Psychiatric/Behavioral:  Positive for sleep disturbance. Negative for depressed mood. The patient is not nervous/anxious.     PMFS History:  Patient Active Problem List   Diagnosis Date Noted   Acute low back pain 02/07/2024   Abnormal laboratory test result 02/07/2024   High risk medication use 06/27/2020   Morbid obesity with BMI of 40.0-44.9, adult (HCC) 04/17/2020   Anosmia 04/17/2020   Pain of breast 04/17/2020   Vitamin D  deficiency 04/17/2020   Bilateral hand pain 04/17/2020   Seropositive rheumatoid arthritis (HCC) 04/17/2020   Facial swelling 04/17/2020   Discoloration of skin of finger 04/17/2020    History reviewed. No pertinent past medical history.  Family History   Problem Relation Age of Onset   Diabetes Mother    Healthy Daughter    Healthy Daughter    Healthy Son    Past Surgical History:  Procedure Laterality Date   CESAREAN SECTION  2012   CESAREAN SECTION  2007   CESAREAN SECTION  2005   INSERTION OF MESH N/A 09/02/2020   Procedure: INSERTION OF MESH;  Surgeon: Paola Dreama SAILOR, MD;  Location: [facility] OR;  Service: General;  Laterality: N/A;   VENTRAL HERNIA REPAIR N/A 09/02/2020   Procedure: LAPAROSCOPIC VENTRAL HERNIA REPAIR;  Surgeon: Paola Dreama SAILOR, MD;  Location: [facility] OR;  Service: General;  Laterality: N/A;   Social History   Social History Narrative   Not on file   Immunization History  Administered Date(s) Administered   Influenza Split 01/23/2014, 10/24/2019   PFIZER(Purple Top)SARS-COV-2 Vaccination 08/24/2019, 09/14/2019   Td 01/15/2003   Tdap 07/23/2009     Objective: Vital Signs: BP (!) 138/99   Pulse 93   Temp 98 F (36.7 C)  Resp 16   Ht 5' 1 (1.549 m)   Wt 233 lb 8 oz (105.9 kg)   LMP 02/02/2024   BMI 44.12 kg/m    Physical Exam   Musculoskeletal Exam: ***   Investigation: No additional findings.  Imaging: No results found.  Recent Labs: Lab Results  Component Value Date   WBC 4.9 01/07/2022   HGB 11.3 (L) 01/07/2022   PLT 321 01/07/2022   NA 142 01/07/2022   K 4.1 01/07/2022   CL 108 01/07/2022   CO2 26 01/07/2022   GLUCOSE 94 01/07/2022   BUN 14 01/07/2022   CREATININE 0.65 01/07/2022   BILITOT 0.3 01/07/2022   AST 14 01/07/2022   ALT 14 01/07/2022   PROT 6.8 01/07/2022   CALCIUM 8.9 01/07/2022   GFRAA 126 04/23/2020    Speciality Comments: No specialty comments available.  Procedures:  No procedures performed Allergies: Patient has no known allergies.   Assessment / Plan:     Visit Diagnoses:  Assessment & Plan Seropositive rheumatoid arthritis (HCC)  Orders:   Anti-DNA antibody, double-stranded   Sedimentation rate   D-Dimer, Quantitative  Acute bilateral low back  pain without sciatica  Orders:   cyclobenzaprine  (FLEXERIL ) 10 MG tablet; Take 1 tablet (10 mg total) by mouth 2 (two) times daily as needed.  Abnormal laboratory test result  Orders:   Anti-DNA antibody, double-stranded   Sedimentation rate   D-Dimer, Quantitative   ***  Follow-Up Instructions: No follow-ups on file.   Lonni LELON Ester, MD  Note - This record has been created using [employer].  Chart creation errors have been sought, but may not always  have been located. Such creation errors do not reflect on  the standard of medical care. "

## 2024-02-07 NOTE — Assessment & Plan Note (Signed)
" °  Orders:   Anti-DNA antibody, double-stranded   Sedimentation rate   D-Dimer, Quantitative  "

## 2024-02-08 ENCOUNTER — Ambulatory Visit: Payer: Self-pay | Admitting: Internal Medicine

## 2024-02-08 ENCOUNTER — Other Ambulatory Visit (HOSPITAL_COMMUNITY): Payer: Self-pay

## 2024-02-08 DIAGNOSIS — M059 Rheumatoid arthritis with rheumatoid factor, unspecified: Secondary | ICD-10-CM

## 2024-02-08 DIAGNOSIS — M545 Low back pain, unspecified: Secondary | ICD-10-CM

## 2024-02-08 LAB — SEDIMENTATION RATE: Sed Rate: 38 mm/h — ABNORMAL HIGH (ref 0–20)

## 2024-02-08 LAB — ANTI-DNA ANTIBODY, DOUBLE-STRANDED: ds DNA Ab: 1 [IU]/mL

## 2024-02-08 LAB — D-DIMER, QUANTITATIVE: D-Dimer, Quant: 0.9 ug{FEU}/mL — ABNORMAL HIGH

## 2024-02-08 MED ORDER — PREDNISONE 20 MG PO TABS
40.0000 mg | ORAL_TABLET | Freq: Every day | ORAL | 0 refills | Status: AC
  Filled 2024-02-08: qty 10, 5d supply, fill #0

## 2024-02-08 NOTE — Progress Notes (Signed)
 Sedimentation rate is increased at 38 consistent with inflammation. D-Dimer is positive at a low level. I do not have strong suspicion of this indicating a clotting problem currently. I am sending a prescription for prednisone  40 mg daily x5 days you can take while along with the flexeril  but should avoid ibuprofen  and aleve while taking the prednisone . Hopefully symptoms resolve otherwise we can follow up as needed to take another look if they don't.

## 2024-02-09 ENCOUNTER — Telehealth: Payer: Self-pay

## 2024-02-09 NOTE — Telephone Encounter (Signed)
 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 Patient states she was given a letter for work, but it's not exactly what she was needing. Patient states she was seen in the office on 02/07/2024. Patient states she has been out of work from 02/06/2024-02/08/2024 due to a flare. Patient states she is going back to work today. Patient states she also did not know if she had lifting restrictions or if that could be included in her letter. Patient states the tubes she lifts at work are 5-6 lbs and patient states she does not want to overdue it. Please advise if okay to provide letter and what can be included.
